# Patient Record
Sex: Female | Born: 1990 | Race: Black or African American | Hispanic: No | Marital: Single | Smoking: Current every day smoker
Health system: Southern US, Community
[De-identification: ages and names within clinical notes are randomized; demographics above are authoritative.]

## PROBLEM LIST (undated history)

## (undated) ENCOUNTER — Ambulatory Visit (HOSPITAL_COMMUNITY): Payer: 59

## (undated) DIAGNOSIS — R011 Cardiac murmur, unspecified: Secondary | ICD-10-CM

---
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
## 2009-04-08 ENCOUNTER — Emergency Department (HOSPITAL_COMMUNITY): Admission: EM | Admit: 2009-04-08 | Discharge: 2009-04-08 | Payer: Self-pay | Admitting: Family Medicine

## 2009-04-23 ENCOUNTER — Emergency Department (HOSPITAL_COMMUNITY): Admission: EM | Admit: 2009-04-23 | Discharge: 2009-04-23 | Payer: Self-pay | Admitting: Emergency Medicine

## 2009-05-21 ENCOUNTER — Emergency Department (HOSPITAL_COMMUNITY): Admission: EM | Admit: 2009-05-21 | Discharge: 2009-05-21 | Payer: Self-pay | Admitting: Emergency Medicine

## 2009-07-20 ENCOUNTER — Emergency Department (HOSPITAL_COMMUNITY): Admission: EM | Admit: 2009-07-20 | Discharge: 2009-07-20 | Payer: Self-pay | Admitting: Emergency Medicine

## 2009-10-21 ENCOUNTER — Emergency Department (HOSPITAL_COMMUNITY)
Admission: EM | Admit: 2009-10-21 | Discharge: 2009-10-21 | Payer: Self-pay | Source: Home / Self Care | Admitting: Emergency Medicine

## 2010-01-06 ENCOUNTER — Emergency Department (HOSPITAL_COMMUNITY)
Admission: EM | Admit: 2010-01-06 | Discharge: 2010-01-06 | Payer: Self-pay | Source: Home / Self Care | Admitting: Emergency Medicine

## 2010-03-26 ENCOUNTER — Emergency Department (HOSPITAL_COMMUNITY)
Admission: EM | Admit: 2010-03-26 | Discharge: 2010-03-26 | Disposition: A | Discharge status: Home / Self Care | Payer: Self-pay | Attending: Emergency Medicine | Admitting: Emergency Medicine

## 2010-03-26 DIAGNOSIS — M545 Low back pain, unspecified: Secondary | ICD-10-CM | POA: Insufficient documentation

## 2010-03-26 DIAGNOSIS — IMO0002 Reserved for concepts with insufficient information to code with codable children: Secondary | ICD-10-CM | POA: Insufficient documentation

## 2010-03-26 DIAGNOSIS — R109 Unspecified abdominal pain: Secondary | ICD-10-CM | POA: Insufficient documentation

## 2010-03-26 DIAGNOSIS — X58XXXA Exposure to other specified factors, initial encounter: Secondary | ICD-10-CM | POA: Insufficient documentation

## 2010-03-26 DIAGNOSIS — Y929 Unspecified place or not applicable: Secondary | ICD-10-CM | POA: Insufficient documentation

## 2010-03-26 LAB — URINE MICROSCOPIC-ADD ON

## 2010-03-26 LAB — POCT I-STAT, CHEM 8
BUN: 11 mg/dL (ref 6–23)
Chloride: 104 mEq/L (ref 96–112)
Glucose, Bld: 99 mg/dL (ref 70–99)
HCT: 40 % (ref 36.0–46.0)

## 2010-03-26 LAB — URINALYSIS, ROUTINE W REFLEX MICROSCOPIC
Ketones, ur: NEGATIVE mg/dL
Nitrite: NEGATIVE
Protein, ur: NEGATIVE mg/dL
Specific Gravity, Urine: 1.017 (ref 1.005–1.030)
Urobilinogen, UA: 1 mg/dL (ref 0.0–1.0)
pH: 7 (ref 5.0–8.0)

## 2010-03-26 LAB — POCT PREGNANCY, URINE: Preg Test, Ur: NEGATIVE

## 2010-03-27 LAB — URINE CULTURE
Colony Count: 70000
Culture  Setup Time: 201202292141

## 2010-04-10 LAB — PREGNANCY, URINE: Preg Test, Ur: NEGATIVE

## 2010-04-10 LAB — URINALYSIS, ROUTINE W REFLEX MICROSCOPIC
Hgb urine dipstick: NEGATIVE
Ketones, ur: NEGATIVE mg/dL
Specific Gravity, Urine: 1.028 (ref 1.005–1.030)
Urobilinogen, UA: 1 mg/dL (ref 0.0–1.0)
pH: 7.5 (ref 5.0–8.0)

## 2010-04-10 LAB — GC/CHLAMYDIA PROBE AMP, GENITAL: GC Probe Amp, Genital: NEGATIVE

## 2010-04-10 LAB — WET PREP, GENITAL: Yeast Wet Prep HPF POC: NONE SEEN

## 2010-04-13 LAB — URINALYSIS, ROUTINE W REFLEX MICROSCOPIC
Glucose, UA: NEGATIVE mg/dL
Nitrite: NEGATIVE
Specific Gravity, Urine: 1.02 (ref 1.005–1.030)
Urobilinogen, UA: 1 mg/dL (ref 0.0–1.0)
pH: 6 (ref 5.0–8.0)

## 2010-04-13 LAB — URINE MICROSCOPIC-ADD ON

## 2010-04-21 LAB — URINALYSIS, ROUTINE W REFLEX MICROSCOPIC
Bilirubin Urine: NEGATIVE
Glucose, UA: NEGATIVE mg/dL
Ketones, ur: NEGATIVE mg/dL
Nitrite: NEGATIVE
Protein, ur: NEGATIVE mg/dL
Specific Gravity, Urine: 1.017 (ref 1.005–1.030)
Urobilinogen, UA: 1 mg/dL (ref 0.0–1.0)
pH: 7 (ref 5.0–8.0)

## 2010-04-21 LAB — URINE MICROSCOPIC-ADD ON

## 2010-04-30 LAB — WET PREP, GENITAL: Trich, Wet Prep: NONE SEEN

## 2010-04-30 LAB — POCT PREGNANCY, URINE: Preg Test, Ur: NEGATIVE

## 2010-04-30 LAB — URINE MICROSCOPIC-ADD ON

## 2010-04-30 LAB — URINALYSIS, ROUTINE W REFLEX MICROSCOPIC
Bilirubin Urine: NEGATIVE
Glucose, UA: NEGATIVE mg/dL
Ketones, ur: NEGATIVE mg/dL
Nitrite: NEGATIVE
Protein, ur: NEGATIVE mg/dL
Specific Gravity, Urine: 1.021 (ref 1.005–1.030)
Urobilinogen, UA: 0.2 mg/dL (ref 0.0–1.0)
pH: 5.5 (ref 5.0–8.0)

## 2010-04-30 LAB — GC/CHLAMYDIA PROBE AMP, GENITAL
Chlamydia, DNA Probe: NEGATIVE
GC Probe Amp, Genital: NEGATIVE
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
## 2010-10-15 ENCOUNTER — Emergency Department (HOSPITAL_COMMUNITY)
Admission: EM | Admit: 2010-10-15 | Discharge: 2010-10-15 | Disposition: A | Discharge status: Home / Self Care | Payer: Self-pay | Attending: Emergency Medicine | Admitting: Emergency Medicine

## 2010-10-15 DIAGNOSIS — M549 Dorsalgia, unspecified: Secondary | ICD-10-CM | POA: Insufficient documentation

## 2010-10-15 DIAGNOSIS — Z87891 Personal history of nicotine dependence: Secondary | ICD-10-CM | POA: Insufficient documentation

## 2010-10-15 LAB — URINALYSIS, ROUTINE W REFLEX MICROSCOPIC
Bilirubin Urine: NEGATIVE
Glucose, UA: NEGATIVE mg/dL
Hgb urine dipstick: NEGATIVE
Ketones, ur: NEGATIVE mg/dL
Leukocytes, UA: NEGATIVE
Nitrite: NEGATIVE
Protein, ur: NEGATIVE mg/dL
Urobilinogen, UA: 1 mg/dL (ref 0.0–1.0)
pH: 6 (ref 5.0–8.0)

## 2010-10-15 LAB — POCT I-STAT, CHEM 8
BUN: 10 mg/dL (ref 6–23)
Sodium: 140 mEq/L (ref 135–145)

## 2011-11-11 ENCOUNTER — Encounter (HOSPITAL_COMMUNITY): Payer: Self-pay | Admitting: *Deleted

## 2011-11-11 ENCOUNTER — Emergency Department (HOSPITAL_COMMUNITY)
Admission: EM | Admit: 2011-11-11 | Discharge: 2011-11-11 | Disposition: A | Discharge status: Home / Self Care | Payer: Self-pay | Attending: Emergency Medicine | Admitting: Emergency Medicine

## 2011-11-11 DIAGNOSIS — F17200 Nicotine dependence, unspecified, uncomplicated: Secondary | ICD-10-CM | POA: Insufficient documentation

## 2011-11-11 DIAGNOSIS — S61209A Unspecified open wound of unspecified finger without damage to nail, initial encounter: Secondary | ICD-10-CM | POA: Insufficient documentation

## 2011-11-11 DIAGNOSIS — W230XXA Caught, crushed, jammed, or pinched between moving objects, initial encounter: Secondary | ICD-10-CM | POA: Insufficient documentation

## 2011-11-11 DIAGNOSIS — Z880 Allergy status to penicillin: Secondary | ICD-10-CM | POA: Insufficient documentation

## 2011-11-11 DIAGNOSIS — S61309A Unspecified open wound of unspecified finger with damage to nail, initial encounter: Secondary | ICD-10-CM

## 2011-11-11 HISTORY — DX: Cardiac murmur, unspecified: R01.1

## 2011-11-11 NOTE — ED Notes (Signed)
Pt fake fingernail broke off last night. Pt complaining of pain to ring finger on left hand.

## 2011-11-11 NOTE — ED Provider Notes (Signed)
History     CSN: [id number]  Arrival date & time 11/11/11  2952   First MD Initiated Contact with Patient 11/11/11 [phone]      Chief Complaint  Patient presents with  . Finger Injury    finger nail    (Consider location/radiation/quality/duration/timing/severity/associated sxs/prior treatment) HPI Comments: Haley Glass is a 21 y.o. Female who presents with complaint of a finger pain after getting it shut in a dryer. States had acrylic long nails on and her nail got pulled back, ripping part of her own finger nail off. Reports pain and partial avulsion of her finger nail. Pt denies bleeding, swelling. No pain with rom of the finger. No weakness or numbness of the finger. Tetanus with in last 5 years. No other injuries. Pt states she rinsed finger with peroxide.    Past Medical History  Diagnosis Date  . Heart murmur     History reviewed. No pertinent past surgical history.  History reviewed. No pertinent family history.  History  Substance Use Topics  . Smoking status: Current Every Day Smoker  . Smokeless tobacco: Not on file  . Alcohol Use: Yes    OB History    Grav Para Term Preterm Abortions TAB SAB Ect Mult Living                  Review of Systems  Constitutional: Negative for fever and chills.  Respiratory: Negative.   Cardiovascular: Negative.   Skin: Positive for wound.  Neurological: Negative for weakness and numbness.    Allergies  Amoxicillin and Penicillins  Home Medications   Current Outpatient Rx  Name Route Sig Dispense Refill  . IBUPROFEN 200 MG PO TABS Oral Take 800 mg by mouth every 6 (six) hours as needed. For headache      BP 125/77  Temp 98.1 F (36.7 C) (Oral)  Resp 18  SpO2 100%  Physical Exam  Nursing note and vitals reviewed. Constitutional: She appears well-developed and well-nourished. No distress.  Cardiovascular: Normal rate, regular rhythm and normal heart sounds.   Pulmonary/Chest: Effort normal and breath sounds  normal. No respiratory distress. She has no wheezes. She has no rales.  Musculoskeletal:       Hands:      Full rom of the left ring finger at all joints. No tenderness to palpation other than over the partially avulsed finger nail. No bleeding.   Neurological: She is alert.  Skin: Skin is warm and dry.    ED Course  Procedures (including critical care time)  Partially avulsed finger nail. No bony tenderness, full rom at all jonints of the finger. Do not think imaging is necessary. Tetanus is up to date. Wound is clean.  I applied bacitracin ointment and a dressing to the finger. No further treatment necessary.  1. Partial avulsion of fingernail       MDM          Lottie Mussel, PA 11/11/11 1640

## 2011-11-12 NOTE — ED Provider Notes (Signed)
Medical screening examination/treatment/procedure(s) were performed by non-physician practitioner and as supervising physician I was immediately available for consultation/collaboration.    Vida Roller, MD 11/12/11 [phone]

## 2012-03-05 ENCOUNTER — Emergency Department (HOSPITAL_COMMUNITY)
Admission: EM | Admit: 2012-03-05 | Discharge: 2012-03-05 | Disposition: A | Discharge status: Home / Self Care | Payer: Self-pay | Attending: Emergency Medicine | Admitting: Emergency Medicine

## 2012-03-05 ENCOUNTER — Encounter (HOSPITAL_COMMUNITY): Payer: Self-pay | Admitting: *Deleted

## 2012-03-05 DIAGNOSIS — Z3202 Encounter for pregnancy test, result negative: Secondary | ICD-10-CM | POA: Insufficient documentation

## 2012-03-05 DIAGNOSIS — F17200 Nicotine dependence, unspecified, uncomplicated: Secondary | ICD-10-CM | POA: Insufficient documentation

## 2012-03-05 DIAGNOSIS — N390 Urinary tract infection, site not specified: Secondary | ICD-10-CM | POA: Insufficient documentation

## 2012-03-05 DIAGNOSIS — N898 Other specified noninflammatory disorders of vagina: Secondary | ICD-10-CM | POA: Insufficient documentation

## 2012-03-05 DIAGNOSIS — R350 Frequency of micturition: Secondary | ICD-10-CM | POA: Insufficient documentation

## 2012-03-05 DIAGNOSIS — R011 Cardiac murmur, unspecified: Secondary | ICD-10-CM | POA: Insufficient documentation

## 2012-03-05 LAB — URINALYSIS, ROUTINE W REFLEX MICROSCOPIC
Bilirubin Urine: NEGATIVE
Glucose, UA: NEGATIVE mg/dL
Ketones, ur: NEGATIVE mg/dL
Protein, ur: NEGATIVE mg/dL

## 2012-03-05 LAB — URINE MICROSCOPIC-ADD ON

## 2012-03-05 MED ORDER — SULFAMETHOXAZOLE-TRIMETHOPRIM 800-160 MG PO TABS: 1.0000 | ORAL_TABLET | Freq: Two times a day (BID) | ORAL | Status: DC

## 2012-03-05 NOTE — ED Notes (Signed)
Pt reports having urinary frequency, pressure when urinating. Also having vaginal discharge and irritation. No acute distress noted at triage.

## 2012-03-05 NOTE — ED Provider Notes (Signed)
History     CSN: [id number]  Arrival date & time 03/05/12  1024   First MD Initiated Contact with Patient 03/05/12 1109      Chief Complaint  Patient presents with  . Dysuria  . Urinary Frequency    (Consider location/radiation/quality/duration/timing/severity/associated sxs/prior treatment) Patient is a 22 y.o. female presenting with dysuria and frequency. The history is provided by the patient.  Dysuria  Associated symptoms include frequency.  Urinary Frequency   patient here with dysuria and suprapubic pain x1 day. No fever or flank pain. History of UTIs and this feels like that. Denies any vaginal bleeding or vaginal discharge. No medications taken prior to arrival. Symptoms worse with urinating  Past Medical History  Diagnosis Date  . Heart murmur     History reviewed. No pertinent past surgical history.  History reviewed. No pertinent family history.  History  Substance Use Topics  . Smoking status: Current Every Day Smoker  . Smokeless tobacco: Not on file  . Alcohol Use: Yes    OB History   Grav Para Term Preterm Abortions TAB SAB Ect Mult Living                  Review of Systems  Genitourinary: Positive for dysuria and frequency.  All other systems reviewed and are negative.    Allergies  Amoxicillin and Penicillins  Home Medications   Current Outpatient Rx  Name  Route  Sig  Dispense  Refill  . ibuprofen (ADVIL,MOTRIN) 200 MG tablet   Oral   Take 800 mg by mouth every 8 (eight) hours as needed (for menstrual pain). For headache           BP 119/69  Pulse 79  Temp(Src) 98.6 F (37 C) (Oral)  Resp 20  SpO2 100%  LMP 02/09/2012  Physical Exam  Nursing note and vitals reviewed. Constitutional: She is oriented to person, place, and time. She appears well-developed and well-nourished.  Non-toxic appearance. No distress.  HENT:  Head: Normocephalic and atraumatic.  Eyes: Conjunctivae, EOM and lids are normal. Pupils are equal, round,  and reactive to light.  Neck: Normal range of motion. Neck supple. No tracheal deviation present. No mass present.  Cardiovascular: Normal rate, regular rhythm and normal heart sounds.  Exam reveals no gallop.   No murmur heard. Pulmonary/Chest: Effort normal and breath sounds normal. No stridor. No respiratory distress. She has no decreased breath sounds. She has no wheezes. She has no rhonchi. She has no rales.  Abdominal: Soft. Normal appearance and bowel sounds are normal. She exhibits no distension. There is tenderness in the suprapubic area. There is no rigidity, no rebound, no guarding and no CVA tenderness.  Musculoskeletal: Normal range of motion. She exhibits no edema and no tenderness.  Neurological: She is alert and oriented to person, place, and time. She has normal strength. No cranial nerve deficit or sensory deficit. GCS eye subscore is 4. GCS verbal subscore is 5. GCS motor subscore is 6.  Skin: Skin is warm and dry. No abrasion and no rash noted.  Psychiatric: She has a normal mood and affect. Her speech is normal and behavior is normal.    ED Course  Procedures (including critical care time)  Labs Reviewed  URINALYSIS, ROUTINE W REFLEX MICROSCOPIC  POCT PREGNANCY, URINE   No results found.   No diagnosis found.    MDM  Pt to be tx for uti        Toy Baker, MD 03/05/12 1230

## 2012-03-05 NOTE — ED Notes (Signed)
Pt discharged to home with family. NAD.  

## 2012-03-07 LAB — URINE CULTURE

## 2012-03-08 NOTE — ED Notes (Signed)
+   Urine Patient treated with Septra-sensitive to same-chart appended per protocol MD. 

## 2014-09-08 ENCOUNTER — Emergency Department (HOSPITAL_COMMUNITY)
Admission: EM | Admit: 2014-09-08 | Discharge: 2014-09-08 | Disposition: A | Discharge status: Home / Self Care | Payer: Self-pay | Attending: Emergency Medicine | Admitting: Emergency Medicine

## 2014-09-08 ENCOUNTER — Encounter (HOSPITAL_COMMUNITY): Payer: Self-pay | Admitting: *Deleted

## 2014-09-08 DIAGNOSIS — Z880 Allergy status to penicillin: Secondary | ICD-10-CM | POA: Insufficient documentation

## 2014-09-08 DIAGNOSIS — R011 Cardiac murmur, unspecified: Secondary | ICD-10-CM | POA: Insufficient documentation

## 2014-09-08 DIAGNOSIS — R202 Paresthesia of skin: Secondary | ICD-10-CM | POA: Insufficient documentation

## 2014-09-08 DIAGNOSIS — Z720 Tobacco use: Secondary | ICD-10-CM | POA: Insufficient documentation

## 2014-09-08 NOTE — ED Notes (Signed)
PT reports Lt shoulder pain worse on Thursday. Pt reports now there is tingling in LT arm this is new. Pt has ROM to LT arm.

## 2014-09-08 NOTE — ED Notes (Signed)
Declined W/C at D/C and was escorted to lobby by RN. 

## 2014-09-08 NOTE — Discharge Instructions (Signed)
Paresthesia Return for any upper extremity weakness or numbness, neck pain. Follow-up with a provider using the resource guide below. Paresthesia is an abnormal burning or prickling sensation. This sensation is generally felt in the hands, arms, legs, or feet. However, it may occur in any part of the body. It is usually not painful. The feeling may be described as:  Tingling or numbness.  "Pins and needles."  Skin crawling.  Buzzing.  Limbs "falling asleep."  Itching. Most people experience temporary (transient) paresthesia at some time in their lives. CAUSES  Paresthesia may occur when you breathe too quickly (hyperventilation). It can also occur without any apparent cause. Commonly, paresthesia occurs when pressure is placed on a nerve. The feeling quickly goes away once the pressure is removed. For some people, however, paresthesia is a long-lasting (chronic) condition caused by an underlying disorder. The underlying disorder may be:  A traumatic, direct injury to nerves. Examples include a:  Broken (fractured) neck.  Fractured skull.  A disorder affecting the brain and spinal cord (central nervous system). Examples include:  Transverse myelitis.  Encephalitis.  Transient ischemic attack.  Multiple sclerosis.  Stroke.  Tumor or blood vessel problems, such as an arteriovenous malformation pressing against the brain or spinal cord.  A condition that damages the peripheral nerves (peripheral neuropathy). Peripheral nerves are not part of the brain and spinal cord. These conditions include:  Diabetes.  Peripheral vascular disease.  Nerve entrapment syndromes, such as carpal tunnel syndrome.  Shingles.  Hypothyroidism.  Vitamin B12 deficiencies.  Alcoholism.  Heavy metal poisoning (lead, arsenic).  Rheumatoid arthritis.  Systemic lupus erythematosus. DIAGNOSIS  Your caregiver will attempt to find the underlying cause of your paresthesia. Your caregiver  may:  Take your medical history.  Perform a physical exam.  Order various lab tests.  Order imaging tests. TREATMENT  Treatment for paresthesia depends on the underlying cause. HOME CARE INSTRUCTIONS  Avoid drinking alcohol.  You may consider massage or acupuncture to help relieve your symptoms.  Keep all follow-up appointments as directed by your caregiver. SEEK IMMEDIATE MEDICAL CARE IF:   You feel weak.  You have trouble walking or moving.  You have problems with speech or vision.  You feel confused.  You cannot control your bladder or bowel movements.  You feel numbness after an injury.  You faint.  Your burning or prickling feeling gets worse when walking.  You have pain, cramps, or dizziness.  You develop a rash. MAKE SURE YOU:  Understand these instructions.  Will watch your condition.  Will get help right away if you are not doing well or get worse. Document Released: 01/02/2002 Document Revised: 04/06/2011 Document Reviewed: 10/03/2010 [facility] Patient Information 2015 [place], [place]. This information is not intended to replace advice given to you by your health care provider. Make sure you discuss any questions you have with your health care provider.  Emergency Department Resource Guide 1) Find a Doctor and Pay Out of Pocket Although you won't have to find out who is covered by your insurance plan, it is a good idea to ask around and get recommendations. You will then need to call the office and see if the doctor you have chosen will accept you as a new patient and what types of options they offer for patients who are self-pay. Some doctors offer discounts or will set up payment plans for their patients who do not have insurance, but you will need to ask so you aren't surprised when you get to your  appointment.  2) Contact Your Local Health Department Not all health departments have doctors that can see patients for sick visits, but many do, so it is  worth a call to see if yours does. If you don't know where your local health department is, you can check in your phone book. The CDC also has a tool to help you locate your state's health department, and many state websites also have listings of all of their local health departments.  3) Find a Walk-in Clinic If your illness is not likely to be very severe or complicated, you may want to try a walk in clinic. These are popping up all over the country in pharmacies, drugstores, and shopping centers. They're usually staffed by nurse practitioners or physician assistants that have been trained to treat common illnesses and complaints. They're usually fairly quick and inexpensive. However, if you have serious medical issues or chronic medical problems, these are probably not your best option.  No Primary Care Doctor: - Call Health Connect at  [phone] - they can help you locate a primary care doctor that  accepts your insurance, provides certain services, etc. - Physician Referral Service- [phone]  Chronic Pain Problems: Organization         Address  Phone   Notes  [facility]  [phone] Patients need to be referred by their primary care doctor.   Medication Assistance: Organization         Address  Phone   Notes  [facility] Medication [facility] [address] [place] [place], [place] [address] [phone] --Must be a resident of [facility] -- Must have NO insurance coverage whatsoever (no Medicaid/ Medicare, etc.) -- The pt. MUST have a primary care doctor that directs their care regularly and follows them in the community   [facility]ist  [phone]   [employer]  [phone]    Agencies that provide inexpensive medical care: Organization         Address  Phone   Notes  [facility]  [phone]   [facility]    [phone]   [facility] [address] [place],  [place] [address] [phone]   [facility] [address] [place] [place] [phone]   Planned Parenthood    [phone]   [facility]    [phone]   [facility]  [address], [place] Phone:  [phone], Fax:  [phone] Hours of Operation:  9 am - 6 pm, M-F.  Also accepts Medicaid/Medicare and self-pay.  [facility]  [address], [place] Phone: [phone], Fax: [phone]. Hours of Operation:  8:30 am - 5:30 pm, M-F.  Also accepts Medicaid and self-pay.  [facility] [place] [address] [place] Phone: [phone]   [facility] [address] Natasha Bence [place], [place] [phone], Ext. [phone] Mondays & Thursdays: 7-9 AM.  First 15 patients are seen on a first come, first serve basis.    Medicaid-accepting [facility] Providers:  Organization         Address  Phone   Notes  [facility] [address], [place] [phone] Also accepts self-pay patients.  [facility] [address] Laurell Josephs [place], [place]  [phone]   [facility] [address], [place] [phone]   [facility] [address] [place] [phone]   Renaye Rakers [address], [place]   [phone] Only accepts [place] Access [place] patients after they have their name applied to their card.   Self-Pay (no insurance) in [facility]:  Organization         Address  Phone   Notes  Sickle Cell Patients, [facility] Internal Medicine [address] [place], [place] [phone]   [facility] [address] [place], [place] [phone]   [facility]  [address], [place] [phone]   [facility]/Dr. Osei-Bonsu  [address], [place] or [address], [place] [phone] Phone number for both [place] and [place] locations is the same.  [facility] [address], [place] [phone]   [facility] [address], [place] or [address] [phone]   [facility] [address] [place] [phone], phone; [phone], fax Sees patients 1st and 3rd Saturday of every month.  Must not qualify for public or private insurance (i.e. Medicaid, Medicare, [place] Health Choice, Veterans' Benefits)  Household income should be no more than 200% of the poverty level The clinic cannot treat you if you are pregnant or think you are pregnant  Sexually transmitted diseases are not treated at the clinic.    Dental Care: Organization         Address  Phone  Notes  [facility] Department of [facility] [address] [place], [place] [phone] Accepts children up to age 13 who are enrolled in [place] or [place] Health Choice; pregnant women with a Medicaid card; and children who have applied for Medicaid or [place] Health Choice, but were declined, whose parents can pay a reduced fee at time of service.  [facility]  [address], [place] [phone] Accepts children up to age 37 who are enrolled in [place] or [place] Health Choice; pregnant women with a Medicaid card; and children who have applied for Medicaid or [place] Health Choice, but were declined, whose parents can pay a reduced fee at time of service.  [facility]  [address] [place], [place] [phone] Patients are seen by appointment only. Walk-ins are not accepted. [facility] will see patients 47 years of age and older. Monday - Tuesday (8am-5pm) Most Wednesdays (8:30-5pm) $30 per visit, cash only  [facility]  [address], [facility] [phone]  Patients are seen by appointment only. Walk-ins are not accepted. [facility] will see patients 86 years of age and older. One Wednesday Evening (Monthly: Volunteer Based).  $30 per visit, cash only  [employer]  [phone] for adults; Children under age 36, call Graduate Pediatric Dentistry at [phone]. Children aged 57-14, please call [phone] to request a pediatric application.  Dental services are provided in all areas of dental care including fillings, crowns and bridges, complete and partial dentures, implants, gum treatment, root canals, and extractions. Preventive care is also provided. Treatment is provided to both adults and children. Patients are selected via a lottery and there is often a waiting list.   [facility] [address], [place]  [phone] [email or web address]   [facility] [address] [place], [place] [phone], Ext. [phone] Second and Fourth Thursday of each month, opens at 6:30 AM; Clinic ends at 9 AM.  Patients are seen on a first-come first-served basis, and a limited number are seen during each clinic.   [facility]  [address] Ether Griffins [place], [place] [phone]   Eligibility Requirements You must have lived in [place], [place], or [place] counties for at least the last three months.   You cannot be eligible for state or federal sponsored [place], including [employer], [place], or [employer].   You generally cannot be eligible for healthcare insurance through your employer.    How to apply: Eligibility screenings are held every Tuesday and Wednesday afternoon from 1:00 pm until 4:00 pm. You do not need an appointment for the interview!  [facility] [address] [place], [place] [phone]   [facility] Health Department  [phone]   [facility] Health Department  [phone]   [facility] Health Department   [phone]    Behavioral Health Resources in the Community: Intensive Outpatient Programs Organization         Address  Phone  Notes  [facility] [address] [place], [place] [phone]   [facility] Outpatient [address] [place], [place] [phone]   [facility] [address] [place], [place]  [phone]   [facility] Mental Health [address],  [place], [place] [phone] or [phone]   Substance Abuse Resources Organization         Address  Phone  Notes  [facility]  [phone]   [facility]  [phone]   [facility] [place]  [phone]   Floydene Flock  [phone]   Residential & Outpatient Substance Abuse Program  [phone]   Psychological Services Organization         Address  Phone  Notes  [facility] Behavioral Health  [phone]   [facility]  [phone]   [facility] [address] [place] [phone] or [phone]    Mobile Crisis Teams Organization         Address  Phone  Notes  [facility], Mobile Crisis Care Unit  [phone]   [facility]  [address] [place], [place] [phone]   Doristine Locks [address] [place] [phone]    Self-Help/Support Groups Organization         Address  Phone             Notes  [facility] [place] - variety of support groups  336- I7437963 Call for more information  Narcotics Anonymous (NA), [facility] [address], [employer] [place]  2 meetings at this location   Statistician         Address  Phone  Notes  [facility] [address],    [place]  [phone]   [facility]  [address], [place] [address], [place], [place] [phone]   [facility] [address] [place], [place] [phone] Admissions: 8am-3pm M-F   [facility] [address]    [place], [place] [phone]   [facility] [address] [place], [place], [place] [phone]   [facility] [address]  [place], [place] [phone]  [facility] - Intensive Outpatient [address], [place], [place] [phone]   °[facility] ([facility] [address]  °[place], [place] [phone] or [phone]   °[facility] ([facility]) [address] [place], [place] [phone] Accepts Medicaid  °[facility] [address]  °[place] 1-800-659-3381 Substance Abuse/Addiction Treatment  ° °[place] Behavioral Health Resources °Organization         Address  Phone  Notes  °[facility]  ([phone]) [phone]   °Julie Brannon, PhD [address] [place], [place]   ([phone]) [phone] or (336) [phone]   °[facility]   [address] °[place], [place] ([phone]) [phone]   °[facility] [address], [place], [place] (336) [phone] Insurance/Medicaid/sponsorship through [facility]  °[facility] [address]                                    [place], [place] ([phone]) [phone] Therapy/tele-psych/case  °[facility] [address]  ° [place], [place] ([phone]) [phone]    °Dr. Arfeen  ([phone]) [phone]   °[facility] [place]  [employer] [place] [facility] 1) [address], [place] °2) [address], [place] °3)  [address], [place] ([phone]) [phone] °(336) [phone] ° °(336) [phone]   °[place] Child Abuse Hotline (336) 342-1394 or (336) [phone] (After Hours)    ° ° ° °

## 2014-09-08 NOTE — ED Provider Notes (Signed)
CSN: [id number]     Arrival date & time 09/08/14  0730 History   First MD Initiated Contact with Patient 09/08/14 [id number]     Chief Complaint  Patient presents with  . Shoulder Pain     (Consider location/radiation/quality/duration/timing/severity/associated sxs/prior Treatment) Patient is a 24 y.o. female presenting with shoulder pain. The history is provided by the patient. No language interpreter was used.  Shoulder Pain Associated symptoms: no neck pain   Haley Glass is a 24 y.o female who presents for intermittent left scapular numbness and tingling, and pain for the past 2 days. She states she has taken both Tylenol and ibuprofen. She denies any injury or fall, or vigorous activity. She denies any fever, neck pain, thoracic or lumbar back pain, headache, upper extremity weakness, chest pain, or shortness of breath. She does administrative work. Past Medical History  Diagnosis Date  . Heart murmur    History reviewed. No pertinent past surgical history. History reviewed. No pertinent family history. Social History  Substance Use Topics  . Smoking status: Current Every Day Smoker  . Smokeless tobacco: None  . Alcohol Use: Yes   OB History    No data available     Review of Systems  Musculoskeletal: Negative for neck pain.  Skin: Negative for rash.  Neurological: Positive for numbness. Negative for weakness.      Allergies  Amoxicillin and Penicillins  Home Medications   Prior to Admission medications   Medication Sig Start Date End Date Taking? Authorizing Provider  ibuprofen (ADVIL,MOTRIN) 200 MG tablet Take 800 mg by mouth every 8 (eight) hours as needed (for menstrual pain). For headache    Historical Provider, MD  sulfamethoxazole-trimethoprim (SEPTRA DS) 800-160 MG per tablet Take 1 tablet by mouth every 12 (twelve) hours. 03/05/12   Lorre Nick, MD   BP 114/70 mmHg  Pulse 69  Temp(Src) 98.4 F (36.9 C) (Oral)  Resp 16  Ht 5\' 6"  (1.676 m)  Wt 198 lb  (89.812 kg)  BMI 31.97 kg/m2  SpO2 100%  LMP 08/24/2014 Physical Exam  Constitutional: She is oriented to person, place, and time. She appears well-developed and well-nourished.  HENT:  Head: Normocephalic.  Eyes: Conjunctivae are normal.  Neck: Normal range of motion. Neck supple.  Cardiovascular: Normal rate.   Pulmonary/Chest: Effort normal.  Musculoskeletal: Normal range of motion. She exhibits no edema.  Tenderness to palpation along the left scapula. No rash, edema or ecchymosis. Full ROM of the left arm including abduction and abduction without difficulty. 5/5 strength. NVI. No scapular winging. No midline cervical, thoracic tenderness to palpation. Full ROM of the neck including flexion and extension without reproducible pain or numbness.  Neurological: She is alert and oriented to person, place, and time.  Skin: Skin is warm and dry.  Psychiatric: She has a normal mood and affect. Her behavior is normal.  Nursing note and vitals reviewed.   ED Course  Procedures (including critical care time) Labs Review Labs Reviewed - No data to display  Imaging Review No results found. I, Catha Gosselin, personally reviewed and evaluated these images and lab results as part of my medical decision-making.   EKG Interpretation None      MDM   Final diagnoses:  Paresthesia   Patient presents for numbness and tingling to the left scapula. Vitals stable. I discussed that this is most likely parasthesias. She can continue taking Tylenol or Motrin as needed for pain. I also discussed return precautions and gave her the resource guide  for follow-up. Patient verbally agrees with the plan.     Catha Gosselin, PA-C 09/08/14 0825  Cathren Laine, MD 09/08/14 [phone]

## 2014-11-16 ENCOUNTER — Emergency Department (HOSPITAL_COMMUNITY)
Admission: EM | Admit: 2014-11-16 | Discharge: 2014-11-16 | Disposition: A | Discharge status: Home / Self Care | Payer: Self-pay | Source: Home / Self Care | Attending: Family Medicine | Admitting: Family Medicine

## 2014-11-16 ENCOUNTER — Encounter (HOSPITAL_COMMUNITY): Payer: Self-pay | Admitting: Emergency Medicine

## 2014-11-16 DIAGNOSIS — H6981 Other specified disorders of Eustachian tube, right ear: Secondary | ICD-10-CM

## 2014-11-16 DIAGNOSIS — T161XXA Foreign body in right ear, initial encounter: Secondary | ICD-10-CM

## 2014-11-16 DIAGNOSIS — R0982 Postnasal drip: Secondary | ICD-10-CM

## 2014-11-16 NOTE — Discharge Instructions (Signed)
Ear Foreign Body °An ear foreign body is an object that is stuck in your ear. The object is usually stuck in the ear canal. °CAUSES °In all ages of people, the most common foreign bodies are insects that enter the ear canal. It is common for young children to put objects into the ear canal. These may include pebbles, beads, parts of toys, and any other small objects that fit into the ear. In adults, objects such as cotton swabs may become lodged in the ear canal.  °SIGNS AND SYMPTOMS °A foreign body in the ear may cause: °· Pain. °· Buzzing or roaring sounds. °· Hearing loss. °· Ear drainage or bleeding. °· Nausea and vomiting. °· A feeling that your ear is full. °DIAGNOSIS °Your health care provider may be able to diagnose an ear foreign body based on the information that you provide, your symptoms, and a physical exam. Your health care provider may also perform tests, such as testing your hearing and your ear pressure, to check for infection or other problems that are caused by the foreign body in your ear. °TREATMENT °Treatment depends on what the foreign body is, the location of the foreign body in your ear, and whether or not the foreign body has injured any part of your inner ear. If the foreign body is visible to your health care provider, it may be possible to remove the foreign body using: °· A tool, such as medical tweezers (forceps) or a suction tube (catheter). °· Irrigation. This uses water to flush the foreign body out of your ear. This is used only if the foreign body is not likely to swell or enlarge when it is put in water. °If the foreign body is not visible or your health care provider was not able to remove the foreign body, you may be referred to a specialist for removal. You may also be prescribed antibiotic medicine or ear drops to prevent infection. If the foreign body has caused injury to other parts of your ear, you may need additional treatment. °HOME CARE INSTRUCTIONS °· Keep all  follow-up visits as directed by your health care provider. This is important. °· Take medicines only as directed by your health care provider. °· If you were prescribed an antibiotic medicine, finish it all even if you start to feel better. °PREVENTION °· Keep small objects out of reach of young children. Tell children not to put anything in their ears. °· Do not put anything in your ear, including cotton swabs, to clean your ears. Talk to your health care provider about how to clean your ears safely. °SEEK MEDICAL CARE IF: °· You have a headache. °· Your have blood coming from your ear. °· You have a fever. °· You have increased pain or swelling of your ear. °· Your hearing is reduced. °· You have discharge coming from your ear. °  °This information is not intended to replace advice given to you by your health care provider. Make sure you discuss any questions you have with your health care provider. °  °Document Released: 01/10/2000 Document Revised: 02/02/2014 Document Reviewed: 08/28/2013 °Elsevier Interactive Patient Education ©2016 [employer] ° °

## 2014-11-16 NOTE — ED Provider Notes (Signed)
CSN: [id number]     Arrival date & time 11/16/14  1742 History   First MD Initiated Contact with Patient 11/16/14 1940     Chief Complaint  Patient presents with  . Otalgia   (Consider location/radiation/quality/duration/timing/severity/associated sxs/prior Treatment) HPI Comments: 3 days ago this 24 year old female had a minor sore throat but states feels better now. She is now complaining of pain to the right ear and pain to the soft tissue posterior to the angle of the right jaw. Denies fever, chills, headache, rhinorrhea.   Past Medical History  Diagnosis Date  . Heart murmur    History reviewed. No pertinent past surgical history. No family history on file. Social History  Substance Use Topics  . Smoking status: Current Every Day Smoker  . Smokeless tobacco: None  . Alcohol Use: Yes   OB History    No data available     Review of Systems  Constitutional: Negative.   HENT: Positive for ear pain and postnasal drip. Negative for congestion, ear discharge, rhinorrhea and sore throat.   Eyes: Negative.   Respiratory: Negative.   Cardiovascular: Negative.   Skin: Negative.   All other systems reviewed and are negative.   Allergies  Amoxicillin and Penicillins  Home Medications   Prior to Admission medications   Medication Sig Start Date End Date Taking? Authorizing Provider  ibuprofen (ADVIL,MOTRIN) 200 MG tablet Take 800 mg by mouth every 8 (eight) hours as needed (for menstrual pain). For headache    Historical Provider, MD  sulfamethoxazole-trimethoprim (SEPTRA DS) 800-160 MG per tablet Take 1 tablet by mouth every 12 (twelve) hours. 03/05/12   Lorre Nick, MD   Meds Ordered and Administered this Visit  Medications - No data to display  BP 127/86 mmHg  Pulse 80  Temp(Src) 98.9 F (37.2 C) (Oral)  SpO2 99%  LMP 11/03/2014 No data found.   Physical Exam  Constitutional: She appears well-developed and well-nourished. No distress.  HENT:  Right EAC with  rectangular foreign body adjacent to the TM. TM is pearly gray transparent and without discoloration or effusion. Left TM is normal. Oropharynx with clear PND otherwise clear.  Eyes: Conjunctivae and EOM are normal.  Neck: Normal range of motion. Neck supple.  Cardiovascular: Normal rate, regular rhythm and normal heart sounds.   Pulmonary/Chest: Effort normal.  Lymphadenopathy:    She has no cervical adenopathy.  Neurological: She is alert.  Skin: Skin is warm and dry.  Nursing note and vitals reviewed.   ED Course  .Foreign Body Removal Date/Time: 11/16/2014 8:38 PM Performed by: Phineas Real, Kirt Chew Authorized by: Ozella Rocks Consent: Verbal consent obtained. Risks and benefits: risks, benefits and alternatives were discussed Consent given by: patient Patient understanding: patient states understanding of the procedure being performed Patient identity confirmed: verbally with patient Body area: ear Location details: right ear Localization method: ENT speculum Removal mechanism: irrigation Complexity: simple 1 objects recovered. Objects recovered: as per MDM Post-procedure assessment: foreign body removed Patient tolerance: Patient tolerated the procedure well with no immediate complications   (including critical care time)  Labs Review Labs Reviewed - No data to display  Imaging Review No results found.   Visual Acuity Review  Right Eye Distance:   Left Eye Distance:   Bilateral Distance:    Right Eye Near:   Left Eye Near:    Bilateral Near:         MDM   1. Foreign body in right ear, initial encounter   2. ETD (eustachian  tube dysfunction), right   3. PND (post-nasal drip)    right ear was irrigated and the foreign body was removed. This appeared to be a fragment of acrylic nail polish. There is no apparent injury to the TM. It is mildly retracted but no erythema or signs of infection. She may continue to take Sudafed PE as needed for congestion and  may add Zyrtec Claritin or Allegra for drainage.   Hayden Rasmussenavid Venus Gilles, NP 11/16/14 2044

## 2014-11-16 NOTE — ED Notes (Signed)
C/o right ear pain onset Tuesday associated w/sore throat that has subsided A&O x4... No acute distress.

## 2015-11-05 ENCOUNTER — Other Ambulatory Visit: Payer: Self-pay | Admitting: Nurse Practitioner

## 2015-11-05 ENCOUNTER — Other Ambulatory Visit (HOSPITAL_COMMUNITY)
Admission: RE | Admit: 2015-11-05 | Discharge: 2015-11-05 | Disposition: A | Discharge status: Home / Self Care | Payer: BLUE CROSS/BLUE SHIELD | Source: Ambulatory Visit | Attending: Obstetrics and Gynecology | Admitting: Obstetrics and Gynecology

## 2015-11-05 DIAGNOSIS — Z01419 Encounter for gynecological examination (general) (routine) without abnormal findings: Secondary | ICD-10-CM | POA: Diagnosis present

## 2015-11-07 LAB — CYTOLOGY - PAP

## 2016-03-04 ENCOUNTER — Encounter (HOSPITAL_COMMUNITY): Payer: Self-pay | Admitting: Family Medicine

## 2016-03-04 ENCOUNTER — Ambulatory Visit (HOSPITAL_COMMUNITY)
Admission: EM | Admit: 2016-03-04 | Discharge: 2016-03-04 | Disposition: A | Discharge status: Home / Self Care | Payer: BLUE CROSS/BLUE SHIELD | Attending: Family Medicine | Admitting: Family Medicine

## 2016-03-04 DIAGNOSIS — K219 Gastro-esophageal reflux disease without esophagitis: Secondary | ICD-10-CM

## 2016-03-04 MED ORDER — OMEPRAZOLE 20 MG PO CPDR: 20.0000 mg | DELAYED_RELEASE_CAPSULE | Freq: Every day | ORAL | 0 refills | Status: DC

## 2016-03-04 NOTE — ED Provider Notes (Signed)
[facility]    CSN: [id number] Arrival date & time: 03/04/16  1619     History   Chief Complaint Chief Complaint  Patient presents with  . Chest Pain    HPI Haley Glass is a 26 y.o. female.   Pt here for chest pain and burning in her chest over a month. sts worse after eating and she has to make her self throw up.  Symptoms are worsening over the past week.  No association with activity.  Symptoms are worse after eating.  She has a burning feeling in her esophagus.  Sometimes the discomfort is intermittent and sometimes it lasts for hours.  Nothing tried yet to relieve symptoms.   She also occasionally gets SOB when she's walking.  No h/o asthma.  She has stopped smoking recently.  Positive F/H asthma.  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 Heart murmur     There are no active problems to display for this patient.   History reviewed. No pertinent surgical history.  OB History    No data available       Home Medications    Prior to Admission medications   Medication Sig Start Date End Date Taking? Authorizing Provider  omeprazole (PRILOSEC) 20 MG capsule Take 1 capsule (20 mg total) by mouth daily. 03/04/16   Elvina SidleKurt Della Scrivener, MD    Family History History reviewed. No pertinent family history.  Social History Social History  Substance Use Topics  . Smoking status: Current Every Day Smoker  . Smokeless tobacco: Never Used  . Alcohol use Yes     Allergies   Amoxicillin and Penicillins   Review of Systems Review of Systems  Constitutional: Negative.   HENT: Negative.   Respiratory: Positive for chest tightness and shortness of breath.      Physical Exam Triage Vital Signs ED Triage Vitals  Enc Vitals Group     BP 03/04/16 1656 126/83     Pulse Rate 03/04/16 1656 64     Resp 03/04/16 1656 18     Temp 03/04/16 1656 98.8 F (37.1 C)     Temp src --     SpO2 03/04/16 1656 98 %     Weight --      Height --      Head Circumference --      Peak Flow --      Pain Score 03/04/16 1658 5     Pain Loc --      Pain Edu? --      Excl. in GC? --    No data found.   Updated Vital Signs BP 126/83   Pulse 64   Temp 98.8 F (37.1 C)   Resp 18   LMP 02/18/2016   SpO2 98%    Physical Exam  Constitutional: She is oriented to person, place, and time. She appears well-developed and well-nourished.  HENT:  Right Ear: External ear normal.  Left Ear: External ear normal.  Mouth/Throat: Oropharynx is clear and moist.  Eyes: Conjunctivae are normal.  Neck: Normal range of motion. Neck supple.  Cardiovascular: Normal rate, regular rhythm and normal heart sounds.   Pulmonary/Chest: Effort normal and breath sounds normal.  Musculoskeletal: Normal range of motion.  Neurological: She is alert and oriented to person, place, and time.  Skin: Skin is warm and dry.  Nursing note and vitals reviewed.  UC Treatments / Results  Labs (all labs ordered are listed, but only abnormal results are displayed) Labs Reviewed - No data to display  EKG  EKG Interpretation None       Radiology No results found.  Procedures Procedures (including critical care time)  Medications Ordered in UC Medications - No data to display   Initial Impression / Assessment and Plan / UC Course  I have reviewed the triage vital signs and the nursing notes.  Pertinent labs & imaging results that were available during my care of the patient were reviewed by me and considered in my medical decision making (see chart for details).     Final Clinical Impressions(s) / UC Diagnoses   Final diagnoses:  Gastroesophageal reflux disease without esophagitis    New Prescriptions New Prescriptions   OMEPRAZOLE (PRILOSEC) 20 MG CAPSULE    Take 1 capsule (20 mg total) by mouth daily.     Elvina Sidle, MD 03/04/16 [phone]

## 2016-03-04 NOTE — ED Triage Notes (Signed)
Pt here for chest pain and burning in her chest over a month. sts worse after eating and she has to make her self throw up.

## 2016-09-20 ENCOUNTER — Ambulatory Visit (HOSPITAL_COMMUNITY)
Admission: EM | Admit: 2016-09-20 | Discharge: 2016-09-20 | Disposition: A | Discharge status: Home / Self Care | Payer: BLUE CROSS/BLUE SHIELD | Attending: Internal Medicine | Admitting: Internal Medicine

## 2016-09-20 ENCOUNTER — Encounter (HOSPITAL_COMMUNITY): Payer: Self-pay | Admitting: Emergency Medicine

## 2016-09-20 DIAGNOSIS — K0889 Other specified disorders of teeth and supporting structures: Secondary | ICD-10-CM | POA: Diagnosis not present

## 2016-09-20 MED ORDER — CLINDAMYCIN HCL 150 MG PO CAPS
450.0000 mg | ORAL_CAPSULE | Freq: Three times a day (TID) | ORAL | 0 refills | Status: AC
Start: 2016-09-20 — End: 2016-09-27

## 2016-09-20 MED ORDER — NAPROXEN 500 MG PO TABS: 500.0000 mg | ORAL_TABLET | Freq: Two times a day (BID) | ORAL | 0 refills | Status: AC

## 2016-09-20 MED ORDER — BENZOCAINE 10 % MT GEL: 1.0000 "application " | OROMUCOSAL | 0 refills | Status: DC | PRN

## 2016-09-20 NOTE — Discharge Instructions (Signed)
Take clindamycin as directed for possible dental abscess. Naproxen and Orajel for dental pain. Follow-up with dentist as scheduled for further treatment needed. Monitor for any worsening of symptoms, swelling of the throat, trouble swallowing, trouble breathing, follow-up for reevaluation.

## 2016-09-20 NOTE — ED Provider Notes (Signed)
[facility]    CSN: [id number] Arrival date & time: 09/20/16  1322     History   Chief Complaint Chief Complaint  Patient presents with  . Dental Pain    HPI CLAIRISSA VALVANO is a 26 y.o. female.   26 year old female comes in for tooth pain. States it's from her right lower wisdom tooth, which she states it is growing sideways. She has an appointment with her dentist 10/01/2016 for removal, but she has started feeling increase throbbing pain, with facial swelling. She states it has now radiated to her right ear. She has not taken anything for the pain, and has not tried anything for the swelling. She denies fever, chills, night sweats. Denies trouble breathing, trouble swallowing, swelling or throat. Denies other URI symptoms such as cough, congestion, sore throat.      Past Medical History:  Diagnosis Date  . Heart murmur     There are no active problems to display for this patient.   History reviewed. No pertinent surgical history.  OB History    No data available       Home Medications    Prior to Admission medications   Medication Sig Start Date End Date Taking? Authorizing Provider  benzocaine (ORAJEL) 10 % mucosal gel Use as directed 1 application in the mouth or throat as needed for mouth pain. 09/20/16   Cathie Hoops, Zuri Bradway V, PA-C  clindamycin (CLEOCIN) 150 MG capsule Take 3 capsules (450 mg total) by mouth 3 (three) times daily. 09/20/16 09/27/16  Cathie Hoops, Arval Brandstetter V, PA-C  naproxen (NAPROSYN) 500 MG tablet Take 1 tablet (500 mg total) by mouth 2 (two) times daily. 09/20/16 09/30/16  Belinda Fisher, PA-C    Family History History reviewed. No pertinent family history.  Social History Social History  Substance Use Topics  . Smoking status: Current Every Day Smoker  . Smokeless tobacco: Never Used  . Alcohol use Yes     Allergies   Amoxicillin and Penicillins   Review of Systems Review of Systems  Reason unable to perform ROS: See HPI as above.     Physical  Exam Triage Vital Signs ED Triage Vitals [09/20/16 1333]  Enc Vitals Group     BP 118/81     Pulse Rate 86     Resp 18     Temp 98.4 F (36.9 C)     Temp Source Oral     SpO2 100 %     Weight      Height      Head Circumference      Peak Flow      Pain Score 5     Pain Loc      Pain Edu?      Excl. in GC?    No data found.   Updated Vital Signs BP 118/81 (BP Location: Right Arm)   Pulse 86   Temp 98.4 F (36.9 C) (Oral)   Resp 18   SpO2 100%    Physical Exam  Constitutional: She is oriented to person, place, and time. She appears well-developed and well-nourished. No distress.  HENT:  Head: Normocephalic and atraumatic.  Right Ear: Tympanic membrane, external ear and ear canal normal. Tympanic membrane is not erythematous and not bulging.  Left Ear: Tympanic membrane, external ear and ear canal normal. Tympanic membrane is not erythematous and not bulging.  Nose: Nose normal.  Mouth/Throat: Uvula is midline, oropharynx is clear and moist and mucous membranes are normal.  Tenderness on  palpation of gums on right lower molars. Specifically around right lower wisdom tooth. Swelling noted.   Pulmonary/Chest: Effort normal. No respiratory distress.  Neurological: She is alert and oriented to person, place, and time.  Skin: Skin is warm and dry.     UC Treatments / Results  Labs (all labs ordered are listed, but only abnormal results are displayed) Labs Reviewed - No data to display  EKG  EKG Interpretation None       Radiology No results found.  Procedures Procedures (including critical care time)  Medications Ordered in UC Medications - No data to display   Initial Impression / Assessment and Plan / UC Course  I have reviewed the triage vital signs and the nursing notes.  Pertinent labs & imaging results that were available during my care of the patient were reviewed by me and considered in my medical decision making (see chart for details).      Due to swelling and tenderness of the gums, will cover for dental abscess. Patient allergic to penicillin and amoxicillin, will cover with clindamycin. Naproxen and Orajel for dental pain. Offered Toradol injection, which patient states she does not think pain is enough to warrant an injection. Patient to follow-up with dentist as scheduled for further evaluation and treatment. Return precautions given.  Final Clinical Impressions(s) / UC Diagnoses   Final diagnoses:  Pain, dental    New Prescriptions New Prescriptions   BENZOCAINE (ORAJEL) 10 % MUCOSAL GEL    Use as directed 1 application in the mouth or throat as needed for mouth pain.   CLINDAMYCIN (CLEOCIN) 150 MG CAPSULE    Take 3 capsules (450 mg total) by mouth 3 (three) times daily.   NAPROXEN (NAPROSYN) 500 MG TABLET    Take 1 tablet (500 mg total) by mouth 2 (two) times daily.      Belinda Fisher, PA-C 09/20/16 1407

## 2016-09-20 NOTE — ED Triage Notes (Signed)
The patient presented to the [facility] with a complaint of right side dental pain and ear pain.

## 2018-10-13 ENCOUNTER — Ambulatory Visit: Payer: BLUE CROSS/BLUE SHIELD | Admitting: Neurology

## 2018-11-26 ENCOUNTER — Other Ambulatory Visit: Payer: Self-pay

## 2018-11-26 ENCOUNTER — Emergency Department (HOSPITAL_COMMUNITY)
Admission: EM | Admit: 2018-11-26 | Discharge: 2018-11-26 | Disposition: A | Discharge status: Home / Self Care | Payer: BC Managed Care – PPO | Attending: Emergency Medicine | Admitting: Emergency Medicine

## 2018-11-26 ENCOUNTER — Encounter (HOSPITAL_COMMUNITY): Payer: Self-pay

## 2018-11-26 DIAGNOSIS — Z5321 Procedure and treatment not carried out due to patient leaving prior to being seen by health care provider: Secondary | ICD-10-CM | POA: Insufficient documentation

## 2018-11-26 DIAGNOSIS — R519 Headache, unspecified: Secondary | ICD-10-CM | POA: Diagnosis not present

## 2018-11-26 NOTE — ED Triage Notes (Signed)
Pt presents with c/o headache that started last night but has since resolved. Pt reports she laid down today to relieve her headache and when she woke up she was feeling some numbness in the left side of her face. Pt c/o some numbness on the top of her back as well. Neurologically intact, no deficits, no chest pain, no other symptoms.

## 2019-08-30 ENCOUNTER — Emergency Department (HOSPITAL_COMMUNITY): Payer: BC Managed Care – PPO

## 2019-08-30 ENCOUNTER — Emergency Department (HOSPITAL_COMMUNITY)
Admission: EM | Admit: 2019-08-30 | Discharge: 2019-08-30 | Disposition: A | Discharge status: Home / Self Care | Payer: BC Managed Care – PPO | Attending: Emergency Medicine | Admitting: Emergency Medicine

## 2019-08-30 ENCOUNTER — Encounter (HOSPITAL_COMMUNITY): Payer: Self-pay | Admitting: Emergency Medicine

## 2019-08-30 DIAGNOSIS — Z5321 Procedure and treatment not carried out due to patient leaving prior to being seen by health care provider: Secondary | ICD-10-CM | POA: Diagnosis not present

## 2019-08-30 DIAGNOSIS — R0789 Other chest pain: Secondary | ICD-10-CM | POA: Insufficient documentation

## 2019-08-30 LAB — BASIC METABOLIC PANEL
Anion gap: 9 (ref 5–15)
BUN: 9 mg/dL (ref 6–20)
CO2: 25 mmol/L (ref 22–32)
Calcium: 8.7 mg/dL — ABNORMAL LOW (ref 8.9–10.3)
Chloride: 105 mmol/L (ref 98–111)
Creatinine, Ser: 0.79 mg/dL (ref 0.44–1.00)
GFR calc Af Amer: >60 mL/min (ref >60)
GFR calc non Af Amer: >60 mL/min (ref >60)
Glucose, Bld: 96 mg/dL (ref 70–99)
Potassium: 3.4 mmol/L — ABNORMAL LOW (ref 3.5–5.1)
Sodium: 139 mmol/L (ref 135–145)

## 2019-08-30 LAB — CBC
HCT: 39.1 % (ref 36.0–46.0)
Hemoglobin: 12.8 g/dL (ref 12.0–15.0)
MCH: 30.5 pg (ref 26.0–34.0)
MCHC: 32.7 g/dL (ref 30.0–36.0)
MCV: 93.3 fL (ref 80.0–100.0)
Platelets: 201 10*3/uL (ref 150–400)
RBC: 4.19 MIL/uL (ref 3.87–5.11)
RDW: 12.3 % (ref 11.5–15.5)
WBC: 7 10*3/uL (ref 4.0–10.5)
nRBC: 0 % (ref 0.0–0.2)

## 2019-08-30 LAB — TROPONIN I (HIGH SENSITIVITY): Troponin I (High Sensitivity): 3 ng/L (ref <18)

## 2019-08-30 MED ORDER — SODIUM CHLORIDE 0.9% FLUSH: 3.0000 mL | Freq: Once | INTRAVENOUS | Status: DC

## 2019-08-30 NOTE — ED Triage Notes (Signed)
Pt reports L sided chest pain that radiates down L arm. Pain began this morning. Denies sob, cough, fever or chills.

## 2020-10-30 ENCOUNTER — Ambulatory Visit: Payer: 59 | Admitting: Diagnostic Neuroimaging

## 2021-02-02 ENCOUNTER — Other Ambulatory Visit: Payer: Self-pay

## 2021-02-02 ENCOUNTER — Encounter (HOSPITAL_COMMUNITY): Payer: Self-pay | Admitting: Emergency Medicine

## 2021-02-02 ENCOUNTER — Emergency Department (HOSPITAL_COMMUNITY)
Admission: EM | Admit: 2021-02-02 | Discharge: 2021-02-02 | Disposition: A | Discharge status: Home / Self Care | Payer: 59 | Attending: Student | Admitting: Student

## 2021-02-02 DIAGNOSIS — H9202 Otalgia, left ear: Secondary | ICD-10-CM | POA: Insufficient documentation

## 2021-02-02 DIAGNOSIS — R519 Headache, unspecified: Secondary | ICD-10-CM | POA: Diagnosis present

## 2021-02-02 DIAGNOSIS — J0101 Acute recurrent maxillary sinusitis: Secondary | ICD-10-CM | POA: Diagnosis not present

## 2021-02-02 MED ORDER — FLUCONAZOLE 150 MG PO TABS: 150.0000 mg | ORAL_TABLET | Freq: Every day | ORAL | 0 refills | Status: AC

## 2021-02-02 MED ORDER — DOXYCYCLINE HYCLATE 100 MG PO CAPS: 100.0000 mg | ORAL_CAPSULE | Freq: Two times a day (BID) | ORAL | 0 refills | Status: AC

## 2021-02-02 MED ORDER — KETOROLAC TROMETHAMINE 15 MG/ML IJ SOLN
15.0000 mg | Freq: Once | INTRAMUSCULAR | Status: AC
  Administered 2021-02-02: 15 mg via INTRAMUSCULAR
  Filled 2021-02-02: qty 1

## 2021-02-02 MED ORDER — FLUCONAZOLE 150 MG PO TABS: 150.0000 mg | ORAL_TABLET | Freq: Every day | ORAL | 0 refills | Status: DC

## 2021-02-02 MED ORDER — TRIAMCINOLONE ACETONIDE 55 MCG/ACT NA AERO: 2.0000 | INHALATION_SPRAY | Freq: Every day | NASAL | 12 refills | Status: DC

## 2021-02-02 MED ORDER — MONTELUKAST SODIUM 10 MG PO TABS: 10.0000 mg | ORAL_TABLET | Freq: Every day | ORAL | 0 refills | Status: DC

## 2021-02-02 NOTE — ED Provider Notes (Signed)
[facility] DEPT Provider Note   CSN: [id number] Arrival date & time: 02/02/21  0754     History  Chief Complaint  Patient presents with   Headache   Otalgia    Haley Glass is a 31 y.o. female presenting for evaluation of left ear pain, sinus pressure.  Patient states for the past several months, she has had at least monthly symptoms of sinus pressure and congestion and ear pain.  She has been seen multiple times by her PCP, often placed on a course of prednisone and given antibiotics.  She has been given both Bactrim and clindamycin without improvement of symptoms.  Her most recent episode of this was December 20, she took the prednisone but did not take antibiotics at that time.  Symptoms resolved until yesterday when she developed recurring pain.  She also has associated headache.  She denies fevers yesterday or today.  Pain is always only in the left ear, however symptoms are present in bilateral sinuses.  No previous history of allergies or sinusitis.  She has no other medical problems, takes no medications daily.  She denies cough, sore throat, chest pain, shortness of breath, nausea, vomiting.  She does not take anything daily for her sinuses   HPI     Home Medications Prior to Admission medications   Medication Sig Start Date End Date Taking? Authorizing Provider  doxycycline (VIBRAMYCIN) 100 MG capsule Take 1 capsule (100 mg total) by mouth 2 (two) times daily for 10 days. 02/02/21 02/12/21 Yes Kenetha Cozza, PA-C  montelukast (SINGULAIR) 10 MG tablet Take 1 tablet (10 mg total) by mouth at bedtime. 02/02/21  Yes Dalton Molesworth, PA-C  triamcinolone (NASACORT) 55 MCG/ACT AERO nasal inhaler Place 2 sprays into the nose daily. 02/02/21  Yes Amneet Cendejas, PA-C  benzocaine (ORAJEL) 10 % mucosal gel Use as directed 1 application in the mouth or throat as needed for mouth pain. 09/20/16   Tasia Catchings, Amy V, PA-C      Allergies    Amoxicillin and  Penicillins    Review of Systems   Review of Systems  Constitutional:  Negative for fever.  HENT:  Positive for congestion, ear pain, postnasal drip, sinus pressure and sinus pain.    Physical Exam Updated Vital Signs BP 126/84 (BP Location: Right Arm)    Pulse 88    Temp 99 F (37.2 C) (Oral)    Resp 16    Ht 5\' 6"  (1.676 m)    Wt 88.5 kg    SpO2 100%    BMI 31.47 kg/m  Physical Exam Vitals and nursing note reviewed.  Constitutional:      General: She is not in acute distress.    Appearance: She is well-developed.     Comments: Resting in the bed in NAD  HENT:     Head: Normocephalic and atraumatic.     Right Ear: Tympanic membrane, ear canal and external ear normal.     Left Ear: Ear canal and external ear normal. A middle ear effusion is present.     Ears:     Comments: Fluid behind left TM, but no erythema or bulging.    Nose: Mucosal edema, congestion and rhinorrhea present. Rhinorrhea is clear.     Right Sinus: Maxillary sinus tenderness present.     Left Sinus: Maxillary sinus tenderness present.     Mouth/Throat:     Mouth: Mucous membranes are moist.     Pharynx: Uvula midline.  Tonsils: No tonsillar exudate.  Eyes:     Conjunctiva/sclera: Conjunctivae normal.     Pupils: Pupils are equal, round, and reactive to light.  Cardiovascular:     Rate and Rhythm: Normal rate and regular rhythm.     Pulses: Normal pulses.  Pulmonary:     Effort: Pulmonary effort is normal. No respiratory distress.     Breath sounds: Normal breath sounds. No decreased breath sounds, wheezing, rhonchi or rales.  Abdominal:     General: There is no distension.     Palpations: Abdomen is soft.     Tenderness: There is no abdominal tenderness.  Musculoskeletal:        General: Normal range of motion.     Cervical back: Normal range of motion.  Lymphadenopathy:     Cervical: No cervical adenopathy.  Skin:    General: Skin is warm.  Neurological:     Mental Status: She is alert and  oriented to person, place, and time.    ED Results / Procedures / Treatments   Labs (all labs ordered are listed, but only abnormal results are displayed) Labs Reviewed - No data to display  EKG None  Radiology No results found.  Procedures Procedures    Medications Ordered in ED Medications  ketorolac (TORADOL) 15 MG/ML injection 15 mg (has no administration in time range)    ED Course/ Medical Decision Making/ A&P                           Medical Decision Making   This patient presents to the ED for concern of sinus pressure, HA, L er pain. This involves an number number of treatment options, and is a complaint that carries with it a low risk of complications and morbidity.  The differential diagnosis includes sinusitis, ear infection, eustachian tube dysfunction, allergies, headache.  Medicines ordered:  I ordered medication including toradol  for HA  Dispostion:  After consideration of the diagnostic results and the patients response to treatment, I feel that the patent would benefit from outpatient management.  Discussed possible etiologies including sinusitis, eustachian tube dysfunction.  Discussed treatment with nasal spray, Singulair.  Offered antibiotics for sinus infection, patient would like to try these.  Discussed importance of taking the complete course of antibiotics if she starts them.  Offered prednisone, patient has some leftover at home.  Per chart review, patient has an appointment scheduled with ENT on January 18, informed patient of this appointment and encouraged her to keep it.  Information given for this clinic.  At this time, patient appears safe for discharge. Return precautions given.  Patient states she understands and agrees to plan.   Final Clinical Impression(s) / ED Diagnoses Final diagnoses:  Acute recurrent maxillary sinusitis  Left ear pain    Rx / DC Orders ED Discharge Orders          Ordered    triamcinolone (NASACORT) 55  MCG/ACT AERO nasal inhaler  Daily        02/02/21 0857    doxycycline (VIBRAMYCIN) 100 MG capsule  2 times daily        02/02/21 0857    montelukast (SINGULAIR) 10 MG tablet  Daily at bedtime        02/02/21 0857              Kaari Zeigler, PA-C 02/02/21 0924    Kommor, Debe Coder, MD 02/02/21 1558

## 2021-02-02 NOTE — ED Triage Notes (Signed)
Patient reports having issues with her ears since august. States she has gone every month for issues. Most recently given prednisone and antibiotics. Starting yesterday about 2:30 in afternoon having headache. Has appt with ENT in March.

## 2021-02-02 NOTE — Discharge Instructions (Signed)
Use the Singulair daily to decrease mucus production. Use the nasal spray daily to decrease inflammation in the nasal passage and help drainage severe pain is not so severe. Take doxycycline as prescribed.  If you start the doxycycline, it is extremely important that you take the entire course. Use Tylenol or ibuprofen as needed for fever or pain. Call the ENT office listed below to confirm or set up a follow-up appointment. Return to the emergency room with any new, worsening, or concerning symptoms.

## 2022-06-10 ENCOUNTER — Ambulatory Visit (HOSPITAL_COMMUNITY)
Admission: EM | Admit: 2022-06-10 | Discharge: 2022-06-10 | Disposition: A | Discharge status: Home / Self Care | Payer: BC Managed Care – PPO | Attending: Internal Medicine | Admitting: Internal Medicine

## 2022-06-10 ENCOUNTER — Encounter (HOSPITAL_COMMUNITY): Payer: Self-pay | Admitting: *Deleted

## 2022-06-10 ENCOUNTER — Other Ambulatory Visit: Payer: Self-pay

## 2022-06-10 DIAGNOSIS — J069 Acute upper respiratory infection, unspecified: Secondary | ICD-10-CM | POA: Diagnosis not present

## 2022-06-10 DIAGNOSIS — R519 Headache, unspecified: Secondary | ICD-10-CM

## 2022-06-10 MED ORDER — METHYLPREDNISOLONE SODIUM SUCC 125 MG IJ SOLR
INTRAMUSCULAR | Status: AC
  Filled 2022-06-10: qty 2

## 2022-06-10 MED ORDER — BENZONATATE 100 MG PO CAPS: 100.0000 mg | ORAL_CAPSULE | Freq: Three times a day (TID) | ORAL | 0 refills | Status: DC

## 2022-06-10 MED ORDER — METHYLPREDNISOLONE SODIUM SUCC 125 MG IJ SOLR
80.0000 mg | Freq: Once | INTRAMUSCULAR | Status: AC
  Administered 2022-06-10: 80 mg via INTRAMUSCULAR

## 2022-06-10 MED ORDER — PROMETHAZINE-DM 6.25-15 MG/5ML PO SYRP: 5.0000 mL | ORAL_SOLUTION | Freq: Every evening | ORAL | 0 refills | Status: DC | PRN

## 2022-06-10 NOTE — ED Triage Notes (Signed)
Pt was on a plane last week and developed a cough ,Ha,congestion .

## 2022-06-10 NOTE — Discharge Instructions (Addendum)
You have a viral upper respiratory infection.   Use the following medicines to help with symptoms: - Plain Mucinex (guaifenesin) over the counter as directed every 12 hours to thin mucous so that you are able to get it out of your body easier. Drink plenty of water while taking this medication so that it works well in your body (at least 8 cups a day).  - Tylenol 1,000mg and/or ibuprofen 600mg every 6 hours with food as needed for aches/pains or fever/chills.  - Tessalon perles every 8 hours as needed for cough. - Take Promethazine DM cough medication to help with your cough at nighttime so that you are able to sleep. Do not drive, drink alcohol, or go to work while taking this medication since it can make you sleepy. Only take this at nighttime.   1 tablespoon of honey in warm water and/or salt water gargles may also help with symptoms. Humidifier to your room will help add water to the air and reduce coughing.  If you develop any new or worsening symptoms, please return.  If your symptoms are severe, please go to the emergency room.  Follow-up with your primary care provider for further evaluation and management of your symptoms as well as ongoing wellness visits.  I hope you feel better!  

## 2022-06-10 NOTE — ED Provider Notes (Signed)
[facility]    CSN: [id number] Arrival date & time: 06/10/22  1154      History   Chief Complaint Chief Complaint  Patient presents with   Fever   Nasal Congestion   Cough   Headache    HPI Haley Glass is a 32 y.o. female.   Patient presents to urgent care for evaluation of cough, congestion, sore throat, headache, and fever/chills that started 2 days ago. At home COVID tests (4) are negative. Cough is productive (dark green sputum) and nasal congestion is yellow. Yesterday fever at 101.6 but responded well to antipyretic. Headache is generalized and currently a 7 on a scale of 0-10. History of migraines, this headache is bilateral and crosses midline. Currently no photophobia or phonophobia, dizziness, nausea, vomiting, abdominal pain, rash, or lightheadedness. No shortness of breath, heart palpitations, or chest pain. Complains of "stopped up ears" as well and decreased hearing. States her ears "popped" after flying. Traveled to [facility] 5 days ago and traveled back 2 days ago when symptoms worsened. Pressure to the ears is worse with swallowing. No history of chronic respiratory problems. Intermittent cigarette smoker, has not smoked since becoming sick, denies other drug use. Using theraflu and mucinex without much relief. Currently afebrile, last dose of aleve was 3am this morning. States she usually gets a "steroid shot" to help with head pain when she gets sick since illness usually triggers migraine. States she got her tragus pierced and she hasn't had migraine in "years" other than when she gets sick.    Fever Associated symptoms: cough and headaches   Cough Associated symptoms: fever and headaches   Headache Associated symptoms: cough and fever     Past Medical History:  Diagnosis Date   Heart murmur     There are no problems to display for this patient.   History reviewed. No pertinent surgical history.  OB History   No obstetric history on  file.      Home Medications    Prior to Admission medications   Medication Sig Start Date End Date Taking? Authorizing Provider  benzonatate (TESSALON) 100 MG capsule Take 1 capsule (100 mg total) by mouth every 8 (eight) hours. 06/10/22  Yes Carlisle Beers, FNP  promethazine-dextromethorphan (PROMETHAZINE-DM) 6.25-15 MG/5ML syrup Take 5 mLs by mouth at bedtime as needed for cough. 06/10/22  Yes Carlisle Beers, FNP  benzocaine (ORAJEL) 10 % mucosal gel Use as directed 1 application in the mouth or throat as needed for mouth pain. 09/20/16   Cathie Hoops, Amy V, PA-C  montelukast (SINGULAIR) 10 MG tablet Take 1 tablet (10 mg total) by mouth at bedtime. 02/02/21   Caccavale, Sophia, PA-C  triamcinolone (NASACORT) 55 MCG/ACT AERO nasal inhaler Place 2 sprays into the nose daily. 02/02/21   Caccavale, Sophia, PA-C    Family History History reviewed. No pertinent family history.  Social History Social History   Tobacco Use   Smoking status: Every Day   Smokeless tobacco: Never  Substance Use Topics   Alcohol use: Yes   Drug use: No     Allergies   Amoxicillin and Penicillins   Review of Systems Review of Systems  Constitutional:  Positive for fever.  Respiratory:  Positive for cough.   Neurological:  Positive for headaches.  Per HPI   Physical Exam Triage Vital Signs ED Triage Vitals  Enc Vitals Group     BP 06/10/22 1228 120/87     Pulse Rate 06/10/22 1228 80  Resp 06/10/22 1228 16     Temp 06/10/22 1228 98.7 F (37.1 C)     Temp src --      SpO2 06/10/22 1228 96 %     Weight --      Height --      Head Circumference --      Peak Flow --      Pain Score 06/10/22 1225 8     Pain Loc --      Pain Edu? --      Excl. in GC? --    No data found.  Updated Vital Signs BP 120/87   Pulse 80   Temp 98.7 F (37.1 C)   Resp 16   LMP 05/04/2022 (Approximate)   SpO2 96%   Visual Acuity Right Eye Distance:   Left Eye Distance:   Bilateral Distance:    Right  Eye Near:   Left Eye Near:    Bilateral Near:     Physical Exam Vitals and nursing note reviewed.  Constitutional:      Appearance: She is not ill-appearing or toxic-appearing.  HENT:     Head: Normocephalic and atraumatic.     Right Ear: Hearing, ear canal and external ear normal. No drainage, swelling or tenderness. A middle ear effusion is present. Tympanic membrane is not perforated.     Left Ear: Hearing, ear canal and external ear normal. No drainage, swelling or tenderness. A middle ear effusion is present. Tympanic membrane is not perforated.     Nose: Nose normal.     Mouth/Throat:     Lips: Pink.     Mouth: Mucous membranes are moist. No injury.     Tongue: No lesions. Tongue does not deviate from midline.     Palate: No mass and lesions.     Pharynx: Oropharynx is clear. Uvula midline. No pharyngeal swelling, oropharyngeal exudate, posterior oropharyngeal erythema or uvula swelling.     Tonsils: No tonsillar exudate or tonsillar abscesses.     Comments: Small amount of clear postnasal drainage visualized to the posterior oropharynx.  Eyes:     General: Lids are normal. Vision grossly intact. Gaze aligned appropriately.     Extraocular Movements: Extraocular movements intact.     Conjunctiva/sclera: Conjunctivae normal.  Cardiovascular:     Rate and Rhythm: Normal rate and regular rhythm.     Heart sounds: Normal heart sounds, S1 normal and S2 normal.  Pulmonary:     Effort: Pulmonary effort is normal. No respiratory distress.     Breath sounds: Normal breath sounds and air entry. No decreased breath sounds, wheezing, rhonchi or rales.  Musculoskeletal:     Cervical back: Neck supple.  Skin:    General: Skin is warm and dry.     Capillary Refill: Capillary refill takes less than 2 seconds.     Findings: No rash.  Neurological:     General: No focal deficit present.     Mental Status: She is alert and oriented to person, place, and time. Mental status is at baseline.      Cranial Nerves: Cranial nerves 2-12 are intact. No dysarthria or facial asymmetry.     Sensory: Sensation is intact.     Motor: Motor function is intact.     Coordination: Coordination is intact.     Gait: Gait is intact.  Psychiatric:        Mood and Affect: Mood normal.        Speech: Speech normal.  Behavior: Behavior normal.        Thought Content: Thought content normal.        Judgment: Judgment normal.      UC Treatments / Results  Labs (all labs ordered are listed, but only abnormal results are displayed) Labs Reviewed - No data to display  EKG   Radiology No results found.  Procedures Procedures (including critical care time)  Medications Ordered in UC Medications  methylPREDNISolone sodium succinate (SOLU-MEDROL) 125 mg/2 mL injection 80 mg (80 mg Intramuscular Given 06/10/22 1311)    Initial Impression / Assessment and Plan / UC Course  I have reviewed the triage vital signs and the nursing notes.  Pertinent labs & imaging results that were available during my care of the patient were reviewed by me and considered in my medical decision making (see chart for details).   1. Viral URI with cough, bad headache Symptoms and physical exam consistent with a viral upper respiratory tract infection that will likely resolve with rest, fluids, and prescriptions for symptomatic relief. Deferred imaging based on stable cardiopulmonary exam and hemodynamically stable vital signs. Deferred viral testing using shared decision making as patient is low risk for severe disease related to potential COVID-19 illness and there is low suspicion for influenza. Discussed updated CDC guidelines for masking related to COVID-19.   Patient given solumedrol 80mg  IM in clinic today for headache. Deferred ketorolac injection due to recent aleve this moring. Aleve has not been helping with headache and she states steroid usually aborts headache. Does not take any abortive medications  for migraines at home. May continue aleve as needed. Neurologically intact to baseline.  Tessalon perles and promethazine DM sent to pharmacy for symptomatic relief to be taken as prescribed.  May continue taking over the counter medications as directed for further symptomatic relief.  Drowsiness precautions discussed regarding promethazine DM prescription.  Nonpharmacologic interventions for symptom relief provided and after visit summary below. Advised to push fluids to stay well hydrated while recovering from viral illness.   Discussed physical exam and available lab work findings in clinic with patient.  Counseled patient regarding appropriate use of medications and potential side effects for all medications recommended or prescribed today. Discussed red flag signs and symptoms of worsening condition,when to call the PCP office, return to urgent care, and when to seek higher level of care in the emergency department. Patient verbalizes understanding and agreement with plan. All questions answered. Patient discharged in stable condition.    Final Clinical Impressions(s) / UC Diagnoses   Final diagnoses:  Viral URI with cough  Bad headache     Discharge Instructions      You have a viral upper respiratory infection.   Use the following medicines to help with symptoms: - Plain Mucinex (guaifenesin) over the counter as directed every 12 hours to thin mucous so that you are able to get it out of your body easier. Drink plenty of water while taking this medication so that it works well in your body (at least 8 cups a day).  - Tylenol 1,000mg  and/or ibuprofen 600mg  every 6 hours with food as needed for aches/pains or fever/chills.  - Tessalon perles every 8 hours as needed for cough. - Take Promethazine DM cough medication to help with your cough at nighttime so that you are able to sleep. Do not drive, drink alcohol, or go to work while taking this medication since it can make you sleepy. Only  take this at nighttime.   1  tablespoon of honey in warm water and/or salt water gargles may also help with symptoms. Humidifier to your room will help add water to the air and reduce coughing.  If you develop any new or worsening symptoms, please return.  If your symptoms are severe, please go to the emergency room.  Follow-up with your primary care provider for further evaluation and management of your symptoms as well as ongoing wellness visits.  I hope you feel better!      ED Prescriptions     Medication Sig Dispense Auth. Provider   benzonatate (TESSALON) 100 MG capsule Take 1 capsule (100 mg total) by mouth every 8 (eight) hours. 21 capsule Carlisle Beers, FNP   promethazine-dextromethorphan (PROMETHAZINE-DM) 6.25-15 MG/5ML syrup Take 5 mLs by mouth at bedtime as needed for cough. 118 mL Carlisle Beers, FNP      PDMP not reviewed this encounter.   Carlisle Beers, [place] 06/10/22 1315

## 2022-10-24 ENCOUNTER — Ambulatory Visit
Admission: EM | Admit: 2022-10-24 | Discharge: 2022-10-24 | Disposition: A | Discharge status: Home / Self Care | Payer: BC Managed Care – PPO | Attending: Internal Medicine | Admitting: Internal Medicine

## 2022-10-24 DIAGNOSIS — Z1152 Encounter for screening for COVID-19: Secondary | ICD-10-CM | POA: Diagnosis not present

## 2022-10-24 DIAGNOSIS — B349 Viral infection, unspecified: Secondary | ICD-10-CM | POA: Diagnosis present

## 2022-10-24 DIAGNOSIS — F17290 Nicotine dependence, other tobacco product, uncomplicated: Secondary | ICD-10-CM | POA: Insufficient documentation

## 2022-10-24 DIAGNOSIS — F17210 Nicotine dependence, cigarettes, uncomplicated: Secondary | ICD-10-CM | POA: Diagnosis not present

## 2022-10-24 MED ORDER — PROMETHAZINE-DM 6.25-15 MG/5ML PO SYRP: 5.0000 mL | ORAL_SOLUTION | Freq: Three times a day (TID) | ORAL | 0 refills | Status: DC | PRN

## 2022-10-24 MED ORDER — PSEUDOEPHEDRINE HCL 30 MG PO TABS: 30.0000 mg | ORAL_TABLET | Freq: Three times a day (TID) | ORAL | 0 refills | Status: DC | PRN

## 2022-10-24 MED ORDER — CETIRIZINE HCL 10 MG PO TABS: 10.0000 mg | ORAL_TABLET | Freq: Every day | ORAL | 0 refills | Status: AC

## 2022-10-24 MED ORDER — DEXAMETHASONE SODIUM PHOSPHATE 10 MG/ML IJ SOLN
10.0000 mg | Freq: Once | INTRAMUSCULAR | Status: AC
  Administered 2022-10-24: 10 mg via INTRAMUSCULAR

## 2022-10-24 NOTE — ED Triage Notes (Signed)
Pt reports left sided sinus pressure, congestion, drainage x 1 day. Mucinex gives some relief. Pt think this is related to a cigarette she smoked this week.

## 2022-10-24 NOTE — Discharge Instructions (Signed)
We will notify you of your test results as they arrive and may take between about 24 hours.  I encourage you to sign up for MyChart if you have not already done so as this can be the easiest way for [place] to communicate results to you online or through a phone app.  Generally, we only contact you if it is a positive test result.  In the meantime, if you develop worsening symptoms including fever, chest pain, shortness of breath despite our current treatment plan then please report to the emergency room as this may be a sign of worsening status from possible viral infection.  Otherwise, we will manage this as a viral syndrome. For sore throat or cough try using a honey-based tea. Use 3 teaspoons of honey with juice squeezed from half lemon. Place shaved pieces of ginger into 1/2-1 cup of water and warm over stove top. Then mix the ingredients and repeat every 4 hours as needed. Please take Tylenol 650mg  every 6 hours for aches and pains, fevers. Hydrate very well with at least 2 liters of water. Eat light meals such as soups to replenish electrolytes and soft fruits, veggies. Start an antihistamine like Zyrtec (10mg  daily) for postnasal drainage, sinus congestion.  You can take this together with pseudoephedrine (Sudafed) at a dose of 30 mg 2-3 times a day as needed for the same kind of congestion.  Use the cough medications as needed.

## 2022-10-24 NOTE — ED Provider Notes (Signed)
[facility]  Note:  This document was prepared using Conservation officer, historic buildings and may include unintentional dictation errors.  MRN: [id number] DOB: Jan 04, 1991  Subjective:   Haley Glass is a 32 y.o. female presenting for 1 day history of left-sided sinus pressure, congestion, drainage, sensation the mucus is now in her chest.  Smokes cigars regularly.  Smoked a cigarette this past week and ever since then has felt sinus symptoms.  No history of asthma.  No overt shortness of breath, wheezing.  No chronic medications.    Allergies  Allergen Reactions   Amoxicillin Anaphylaxis   Penicillins Anaphylaxis and Other (See Comments)   Metronidazole Other (See Comments)    Past Medical History:  Diagnosis Date   Heart murmur      History reviewed. No pertinent surgical history.  History reviewed. No pertinent family history.  Social History   Tobacco Use   Smoking status: Every Day    Types: Cigarettes   Smokeless tobacco: Never  Substance Use Topics   Alcohol use: Yes   Drug use: No    ROS   Objective:   Vitals: BP (!) 139/91 (BP Location: Right Arm)   Pulse 84   Temp 98.5 F (36.9 C) (Oral)   Resp 16   LMP 10/11/2022 (Exact Date)   SpO2 97%   Physical Exam Constitutional:      General: She is not in acute distress.    Appearance: Normal appearance. She is well-developed and normal weight. She is not ill-appearing, toxic-appearing or diaphoretic.  HENT:     Head: Normocephalic and atraumatic.     Right Ear: Tympanic membrane, ear canal and external ear normal. No drainage or tenderness. No middle ear effusion. There is no impacted cerumen. Tympanic membrane is not erythematous or bulging.     Left Ear: Tympanic membrane, ear canal and external ear normal. No drainage or tenderness.  No middle ear effusion. There is no impacted cerumen. Tympanic membrane is not erythematous or bulging.     Nose: Nose normal. No congestion  or rhinorrhea.     Mouth/Throat:     Mouth: Mucous membranes are moist. No oral lesions.     Pharynx: No pharyngeal swelling, oropharyngeal exudate, posterior oropharyngeal erythema or uvula swelling.     Tonsils: No tonsillar exudate or tonsillar abscesses.  Eyes:     General: No scleral icterus.       Right eye: No discharge.        Left eye: No discharge.     Extraocular Movements: Extraocular movements intact.     Right eye: Normal extraocular motion.     Left eye: Normal extraocular motion.     Conjunctiva/sclera: Conjunctivae normal.  Cardiovascular:     Rate and Rhythm: Normal rate and regular rhythm.     Heart sounds: Normal heart sounds. No murmur heard.    No friction rub. No gallop.  Pulmonary:     Effort: Pulmonary effort is normal. No respiratory distress.     Breath sounds: No stridor. No wheezing, rhonchi or rales.  Chest:     Chest wall: No tenderness.  Musculoskeletal:     Cervical back: Normal range of motion and neck supple.  Lymphadenopathy:     Cervical: No cervical adenopathy.  Skin:    General: Skin is warm and dry.  Neurological:     General: No focal deficit present.     Mental Status: She is alert and oriented to person, place, and  time.  Psychiatric:        Mood and Affect: Mood normal.        Behavior: Behavior normal.     Assessment and Plan :   PDMP not reviewed this encounter.  1. Acute viral syndrome    Discussed antibiotic stewardship, patient is agreeable to this.  Given her respiratory symptoms, regular smoking, recommended steroids which she has done well with in the past.  Patient prefers steroid injection.  IM dexamethasone 10 mg administered in clinic.  COVID testing pending.  Recommend general supportive care for an acute viral syndrome. Counseled patient on potential for adverse effects with medications prescribed/recommended today, ER and return-to-clinic precautions discussed, patient verbalized understanding.    Wallis Bamberg,  [place] 10/24/22 [phone]

## 2022-10-25 LAB — SARS CORONAVIRUS 2 (TAT 6-24 HRS): SARS Coronavirus 2: NEGATIVE

## 2022-12-28 ENCOUNTER — Encounter (HOSPITAL_COMMUNITY): Payer: Self-pay

## 2022-12-28 ENCOUNTER — Other Ambulatory Visit: Payer: Self-pay

## 2022-12-28 ENCOUNTER — Emergency Department (HOSPITAL_COMMUNITY)
Admission: EM | Admit: 2022-12-28 | Discharge: 2022-12-28 | Disposition: A | Discharge status: Home / Self Care | Payer: BC Managed Care – PPO | Attending: Emergency Medicine | Admitting: Emergency Medicine

## 2022-12-28 ENCOUNTER — Emergency Department (HOSPITAL_BASED_OUTPATIENT_CLINIC_OR_DEPARTMENT_OTHER): Payer: BC Managed Care – PPO

## 2022-12-28 DIAGNOSIS — R1030 Lower abdominal pain, unspecified: Secondary | ICD-10-CM | POA: Insufficient documentation

## 2022-12-28 DIAGNOSIS — M79605 Pain in left leg: Secondary | ICD-10-CM

## 2022-12-28 DIAGNOSIS — S8012XA Contusion of left lower leg, initial encounter: Secondary | ICD-10-CM | POA: Insufficient documentation

## 2022-12-28 DIAGNOSIS — S8992XA Unspecified injury of left lower leg, initial encounter: Secondary | ICD-10-CM | POA: Diagnosis present

## 2022-12-28 DIAGNOSIS — M7989 Other specified soft tissue disorders: Secondary | ICD-10-CM

## 2022-12-28 DIAGNOSIS — X58XXXA Exposure to other specified factors, initial encounter: Secondary | ICD-10-CM | POA: Insufficient documentation

## 2022-12-28 DIAGNOSIS — T148XXA Other injury of unspecified body region, initial encounter: Secondary | ICD-10-CM

## 2022-12-28 LAB — COMPREHENSIVE METABOLIC PANEL
ALT: 14 U/L (ref 0–44)
AST: 22 U/L (ref 15–41)
Albumin: 4.1 g/dL (ref 3.5–5.0)
Alkaline Phosphatase: 53 U/L (ref 38–126)
Anion gap: 5 (ref 5–15)
BUN: 7 mg/dL (ref 6–20)
CO2: 25 mmol/L (ref 22–32)
Calcium: 8.2 mg/dL — ABNORMAL LOW (ref 8.9–10.3)
Chloride: 106 mmol/L (ref 98–111)
Creatinine, Ser: 0.9 mg/dL (ref 0.44–1.00)
GFR, Estimated: >60 mL/min (ref >60)
Glucose, Bld: 96 mg/dL (ref 70–99)
Potassium: 3.4 mmol/L — ABNORMAL LOW (ref 3.5–5.1)
Sodium: 136 mmol/L (ref 135–145)
Total Bilirubin: 0.6 mg/dL (ref <1.2)
Total Protein: 7.2 g/dL (ref 6.5–8.1)

## 2022-12-28 LAB — CBC WITH DIFFERENTIAL/PLATELET
Abs Immature Granulocytes: 0.01 10*3/uL (ref 0.00–0.07)
Basophils Absolute: 0 10*3/uL (ref 0.0–0.1)
Basophils Relative: 0 %
Eosinophils Absolute: 0.1 10*3/uL (ref 0.0–0.5)
Eosinophils Relative: 1 %
HCT: 40.4 % (ref 36.0–46.0)
Hemoglobin: 13.1 g/dL (ref 12.0–15.0)
Immature Granulocytes: 0 %
Lymphocytes Relative: 33 %
Lymphs Abs: 2.4 10*3/uL (ref 0.7–4.0)
MCH: 30.2 pg (ref 26.0–34.0)
MCHC: 32.4 g/dL (ref 30.0–36.0)
MCV: 93.1 fL (ref 80.0–100.0)
Monocytes Absolute: 0.4 10*3/uL (ref 0.1–1.0)
Monocytes Relative: 6 %
Neutro Abs: 4.2 10*3/uL (ref 1.7–7.7)
Neutrophils Relative %: 60 %
Platelets: 201 10*3/uL (ref 150–400)
RBC: 4.34 MIL/uL (ref 3.87–5.11)
RDW: 12.5 % (ref 11.5–15.5)
WBC: 7.2 10*3/uL (ref 4.0–10.5)
nRBC: 0 % (ref 0.0–0.2)

## 2022-12-28 LAB — URINALYSIS, ROUTINE W REFLEX MICROSCOPIC
Bilirubin Urine: NEGATIVE
Glucose, UA: NEGATIVE mg/dL
Hgb urine dipstick: NEGATIVE
Ketones, ur: NEGATIVE mg/dL
Leukocytes,Ua: NEGATIVE
Nitrite: NEGATIVE
Protein, ur: NEGATIVE mg/dL
Specific Gravity, Urine: 1.012 (ref 1.005–1.030)
pH: 6 (ref 5.0–8.0)

## 2022-12-28 LAB — LIPASE, BLOOD: Lipase: 38 U/L (ref 11–51)

## 2022-12-28 LAB — HCG, SERUM, QUALITATIVE: Preg, Serum: NEGATIVE

## 2022-12-28 NOTE — Discharge Instructions (Signed)
The test today in the ED were reassuring.  Your blood tests are normal.  The ultrasound does not show any blood clot.  Follow-up with your doctor to be rechecked.

## 2022-12-28 NOTE — ED Triage Notes (Addendum)
Pt presenting today complaining of pain in both legs, but mostly in left leg. Pt also noticed bruising on her legs, denies bumping her legs or hitting it on anything that would explain the bruising. Today pt woke up and noticed they appears to be swelling too. Concerned that she may have a blood clot since she has a family hx of them.

## 2022-12-28 NOTE — ED Provider Notes (Addendum)
[place] EMERGENCY DEPARTMENT AT [facility] Provider Note   CSN: [id number] Arrival date & time: 12/28/22  1150     History  Chief Complaint  Patient presents with   Leg Pain    Haley Glass is a 32 y.o. female.   Leg Pain    Patient has a history of irregular menstrual periods.  Patient has been seeing an OB/GYN doctor.  She has had ultrasounds and is scheduled for repeat evaluation but has not had that yet.  Patient however comes to the ED with complaints of leg swelling and bruising.  Patient states she has noticed a heavy sensation in her left leg.  It also has felt more swollen than her right.  This morning she noticed she had several small areas of bruising on her leg and does not recall any specific injury.  Patient states there is a family history of DVT and she was concerned this was what she had on.  Patient also reports having some persistent abdominal pressure in her lower abdomen for the last couple weeks.  She has not any vomiting or diarrhea.  She has been having some issues with her menstrual periods and has been seeing an OB/GYN doctor but was not sure if this was related to her bruising issue    patient denies any occasions no enzymes Home Medications Prior to Admission medications   Medication Sig Start Date End Date Taking? Authorizing Provider  cetirizine (ZYRTEC ALLERGY) 10 MG tablet Take 1 tablet (10 mg total) by mouth daily. 10/24/22  Yes Wallis Bamberg, PA-C  dextromethorphan-guaiFENesin [facility] DM) 30-600 MG 12hr tablet Take 1 tablet by mouth 2 (two) times daily.   Yes [provider]  pseudoephedrine (SUDAFED) 30 MG tablet Take 1 tablet (30 mg total) by mouth every 8 (eight) hours as needed for congestion. 10/24/22  Yes Wallis Bamberg, PA-C      Allergies    Amoxicillin, Penicillins, and Metronidazole    Review of Systems   Review of Systems  Physical Exam Updated Vital Signs BP 124/87   Pulse 61   Temp 98.3 F (36.8 C)  (Oral)   Resp 18   Ht 1.676 m (5\' 6" )   Wt 97.5 kg   LMP 12/02/2022 (Exact Date)   SpO2 100%   BMI 34.70 kg/m  Physical Exam Vitals and nursing note reviewed.  Constitutional:      General: She is not in acute distress.    Appearance: She is well-developed.  HENT:     Head: Normocephalic and atraumatic.     Right Ear: External ear normal.     Left Ear: External ear normal.  Eyes:     General: No scleral icterus.       Right eye: No discharge.        Left eye: No discharge.     Conjunctiva/sclera: Conjunctivae normal.  Neck:     Trachea: No tracheal deviation.  Cardiovascular:     Rate and Rhythm: Normal rate and regular rhythm.  Pulmonary:     Effort: Pulmonary effort is normal. No respiratory distress.     Breath sounds: Normal breath sounds. No stridor. No wheezing or rales.  Abdominal:     General: Bowel sounds are normal. There is no distension.     Palpations: Abdomen is soft.     Tenderness: There is no abdominal tenderness. There is no guarding or rebound.     Comments: Mild tenderness suprapubic region  Musculoskeletal:  General: No tenderness or deformity.     Cervical back: Neck supple.     Comments: Mild edema noted left lower extremity, few faint areas of ecchymoses on her upper and lower leg, approximately few centimeters in size  Skin:    General: Skin is warm and dry.     Findings: No rash.     Comments: No bruising noted upper extremities  Neurological:     General: No focal deficit present.     Mental Status: She is alert.     Cranial Nerves: No cranial nerve deficit, dysarthria or facial asymmetry.     Sensory: No sensory deficit.     Motor: No abnormal muscle tone or seizure activity.     Coordination: Coordination normal.  Psychiatric:        Mood and Affect: Mood normal.     ED Results / Procedures / Treatments   Labs (all labs ordered are listed, but only abnormal results are displayed) Labs Reviewed  COMPREHENSIVE METABOLIC PANEL  - Abnormal; Notable for the following components:      Result Value   Potassium 3.4 (*)    Calcium 8.2 (*)    All other components within normal limits  LIPASE, BLOOD  CBC WITH DIFFERENTIAL/PLATELET  URINALYSIS, ROUTINE W REFLEX MICROSCOPIC  HCG, SERUM, QUALITATIVE    EKG None  Radiology VAS [place] LOWER EXTREMITY VENOUS (DVT) (7a-7p)  Result Date: 12/28/2022  Lower Venous DVT Study Patient Name:  Haley Glass  Date of Exam:   12/28/2022 Medical Rec #: [id number]          Accession #:    [id number] Date of Birth: [birth year]         Patient Gender: F Patient Age:   32 years Exam Location:  [facility] [place] Procedure:      VAS [place] LOWER EXTREMITY VENOUS (DVT) Referring Phys: Hermie Reagor --------------------------------------------------------------------------------  Indications: Pain, Swelling, and Edema.  Comparison Study: No prior exam. Performing Technologist: Fernande Bras  Examination Guidelines: A complete evaluation includes B-mode imaging, spectral Doppler, color Doppler, and power Doppler as needed of all accessible portions of each vessel. Bilateral testing is considered an integral part of a complete examination. Limited examinations for reoccurring indications may be performed as noted. The reflux portion of the exam is performed with the patient in reverse Trendelenburg.  +-----+---------------+---------+-----------+----------+--------------+ RIGHTCompressibilityPhasicitySpontaneityPropertiesThrombus Aging +-----+---------------+---------+-----------+----------+--------------+ CFV  Full           Yes      Yes                                 +-----+---------------+---------+-----------+----------+--------------+   +---------+---------------+---------+-----------+----------+--------------+ LEFT     CompressibilityPhasicitySpontaneityPropertiesThrombus Aging +---------+---------------+---------+-----------+----------+--------------+ CFV      Full           Yes       Yes                                 +---------+---------------+---------+-----------+----------+--------------+ SFJ      Full                                                        +---------+---------------+---------+-----------+----------+--------------+ FV Prox  Full                                                        +---------+---------------+---------+-----------+----------+--------------+  FV Mid   Full                                                        +---------+---------------+---------+-----------+----------+--------------+ FV DistalFull                                                        +---------+---------------+---------+-----------+----------+--------------+ PFV      Full                                                        +---------+---------------+---------+-----------+----------+--------------+ POP      Full           Yes      Yes                                 +---------+---------------+---------+-----------+----------+--------------+ PTV      Full                                                        +---------+---------------+---------+-----------+----------+--------------+ PERO     Full                                                        +---------+---------------+---------+-----------+----------+--------------+     Summary: RIGHT: - No evidence of common femoral vein obstruction.   LEFT: - There is no evidence of deep vein thrombosis in the lower extremity.  - No cystic structure found in the popliteal fossa.  *See table(s) above for measurements and observations.    Preliminary     Procedures Procedures    Medications Ordered in ED Medications - No data to display  ED Course/ Medical Decision Making/ A&P Clinical Course as of 12/28/22 1512  Mon Dec 28, 2022  1315 CBC with Diff CBC normal [JK]  1358 Metabolic panel normal.  Pregnancy test negative.  Lipase normal [JK]  1500 Doppler study negative  for DVT [JK]  1510 urinalysis without signs of infection [JK]    Clinical Course User Index [JK] Linwood Dibbles, MD                                 Medical Decision Making Problems Addressed: Bruising: acute illness or injury Left leg pain: acute illness or injury that poses a threat to life or bodily functions  Amount and/or Complexity of Data Reviewed Labs: ordered. Decision-making details documented in ED Course. Radiology: ordered and independent interpretation performed.   Patient presented to ED for evaluation of leg discomfort as well as easy bruising.  Patient does  not have any bruising elsewhere.  No ecchymoses noted in upper upper extremities or torso.  ED workup reassuring.  She is not anemic.  She is not thrombocytopenic.  Patient has a normal white blood cell count.  Ultrasound does not show any evidence of DVT.  Possible her symptoms may be radicular in nature.  No signs of infection.  She has normal perfusion.  Evaluation and diagnostic testing in the emergency department does not suggest an emergent condition requiring admission or immediate intervention beyond what has been performed at this time.  The patient is safe for discharge and has been instructed to return immediately for worsening symptoms, change in symptoms or any other concerns.         Final Clinical Impression(s) / ED Diagnoses Final diagnoses:  Left leg pain  Bruising    Rx / DC Orders ED Discharge Orders     None         Linwood Dibbles, MD 12/28/22 1512    Linwood Dibbles, MD 12/28/22 [phone]

## 2023-12-26 ENCOUNTER — Ambulatory Visit
Admission: EM | Admit: 2023-12-26 | Discharge: 2023-12-26 | Disposition: A | Discharge status: Home / Self Care | Attending: Family Medicine | Admitting: Family Medicine

## 2023-12-26 DIAGNOSIS — S86911A Strain of unspecified muscle(s) and tendon(s) at lower leg level, right leg, initial encounter: Secondary | ICD-10-CM

## 2023-12-26 DIAGNOSIS — S86912A Strain of unspecified muscle(s) and tendon(s) at lower leg level, left leg, initial encounter: Secondary | ICD-10-CM

## 2023-12-26 MED ORDER — NAPROXEN 500 MG PO TABS: 500.0000 mg | ORAL_TABLET | Freq: Two times a day (BID) | ORAL | 0 refills | Status: AC | PRN

## 2023-12-26 NOTE — ED Triage Notes (Signed)
 Patient here today with c/o bilat knee pain after working on her floors this past week. Patient states that she has since been having some weakness and tenderness in both knees since Thursday or Friday. Patient states that she has some instability when she gets out of bed in the morning and has also had some dizziness. Patient has taken BC powder and Excedrin with some relief.

## 2023-12-26 NOTE — Discharge Instructions (Addendum)
 Use the knee sleeves to help with swelling and support of the knee joint.  Start naproxen  twice daily as needed for pain/inflammation.  If symptoms do not improve in 1 week and/or they worsen please follow-up with your PCP for further evaluation.  Hope you feel better soon!

## 2023-12-26 NOTE — ED Provider Notes (Signed)
 [facility]    CSN: [id number] Arrival date & time: 12/26/23  1001      History   Chief Complaint Chief Complaint  Patient presents with   Knee Pain    HPI Haley Glass is a 33 y.o. female presents for knee pain.  Patient reports a week ago she was working on flooring in her home causing her to be up and down frequently.  States since then she been having bilateral knee pain and feels like they are weak and going to give out.  Does state they feel swollen but no bruising numbness or tingling.  No injury such as fall.  No history of knee surgeries or injuries in the past.  She is taking BC powder and Excedrin with some relief.  Patient denies pregnancy or breast-feeding.  No other concerns at this time  Knee Pain   Past Medical History:  Diagnosis Date   Heart murmur     There are no active problems to display for this patient.   History reviewed. No pertinent surgical history.  OB History   No obstetric history on file.      Home Medications    Prior to Admission medications   Medication Sig Start Date End Date Taking? Authorizing Provider  naproxen  (NAPROSYN ) 500 MG tablet Take 1 tablet (500 mg total) by mouth 2 (two) times daily as needed for moderate pain (pain score 4-6) (knee pain, inflammation). 12/26/23  Yes Cinch Ormond, Jodi R, NP  cetirizine  (ZYRTEC  ALLERGY) 10 MG tablet Take 1 tablet (10 mg total) by mouth daily. 10/24/22   Christopher Savannah, PA-C    Family History History reviewed. No pertinent family history.  Social History Social History   Tobacco Use   Smoking status: Every Day    Types: Cigarettes   Smokeless tobacco: Never  Vaping Use   Vaping status: Never Used  Substance Use Topics   Alcohol use: Yes    Comment: Rare   Drug use: No     Allergies   Amoxicillin, Penicillins, and Metronidazole   Review of Systems Review of Systems  Musculoskeletal:        Bilateral knee pain x 1 week     Physical Exam Triage Vital  Signs ED Triage Vitals  Encounter Vitals Group     BP 12/26/23 1017 111/80     Girls Systolic BP Percentile --      Girls Diastolic BP Percentile --      Boys Systolic BP Percentile --      Boys Diastolic BP Percentile --      Pulse Rate 12/26/23 1017 88     Resp 12/26/23 1017 16     Temp 12/26/23 1017 98 F (36.7 C)     Temp Source 12/26/23 1017 Oral     SpO2 12/26/23 1017 98 %     Weight --      Height --      Head Circumference --      Peak Flow --      Pain Score 12/26/23 1012 5     Pain Loc --      Pain Education --      Exclude from Growth Chart --    No data found.  Updated Vital Signs BP 111/80 (BP Location: Left Arm)   Pulse 88   Temp 98 F (36.7 C) (Oral)   Resp 16   LMP 07/31/2023 (Exact Date)   SpO2 98%   Visual Acuity Right Eye Distance:  Left Eye Distance:   Bilateral Distance:    Right Eye Near:   Left Eye Near:    Bilateral Near:     Physical Exam Vitals and nursing note reviewed.  Constitutional:      General: She is not in acute distress.    Appearance: Normal appearance. She is not ill-appearing, toxic-appearing or diaphoretic.  HENT:     Head: Normocephalic and atraumatic.  Eyes:     Pupils: Pupils are equal, round, and reactive to light.  Cardiovascular:     Rate and Rhythm: Normal rate.  Pulmonary:     Effort: Pulmonary effort is normal.  Musculoskeletal:     Right knee: No swelling, deformity, effusion, erythema, ecchymosis, lacerations or bony tenderness. Normal range of motion. Tenderness present over the medial joint line and lateral joint line. No patellar tendon tenderness. Normal alignment and normal patellar mobility.     Instability Tests: Anterior drawer test negative.     Left knee: No swelling, deformity, effusion, erythema, ecchymosis, lacerations or bony tenderness. Normal range of motion. Tenderness present over the medial joint line and lateral joint line. No patellar tendon tenderness. Normal alignment and normal  patellar mobility.     Instability Tests: Anterior drawer test negative.     Comments: Negative valgus and varus stress test  Skin:    General: Skin is warm.  Neurological:     General: No focal deficit present.     Mental Status: She is alert and oriented to person, place, and time.  Psychiatric:        Mood and Affect: Mood normal.        Behavior: Behavior normal.      UC Treatments / Results  Labs (all labs ordered are listed, but only abnormal results are displayed) Labs Reviewed - No data to display  EKG   Radiology No results found.  Procedures Procedures (including critical care time)  Medications Ordered in UC Medications - No data to display  Initial Impression / Assessment and Plan / UC Course  I have reviewed the triage vital signs and the nursing notes.  Pertinent labs & imaging results that were available during my care of the patient were reviewed by me and considered in my medical decision making (see chart for details).     Reviewed exam and symptoms with patient.  Discussed knee strain given recent activity.  Will defer imaging as no injury.  Patient fitted with bilateral knee braces and reviewed RICE therapy.  Will do trial of naproxen  twice daily as needed.  Patient to follow-up with PCP in 1 week if symptoms or not improving.  ER precautions reviewed. Final Clinical Impressions(s) / UC Diagnoses   Final diagnoses:  Strain of both knees, initial encounter     Discharge Instructions      Use the knee sleeves to help with swelling and support of the knee joint.  Start naproxen  twice daily as needed for pain/inflammation.  If symptoms do not improve in 1 week and/or they worsen please follow-up with your PCP for further evaluation.  Hope you feel better soon!    ED Prescriptions     Medication Sig Dispense Auth. Provider   naproxen  (NAPROSYN ) 500 MG tablet Take 1 tablet (500 mg total) by mouth 2 (two) times daily as needed for moderate pain  (pain score 4-6) (knee pain, inflammation). 14 tablet Awanda Wilcock, Jodi R, NP      PDMP not reviewed this encounter.   Loreda Myla SAUNDERS, NP 12/26/23 [phone]

## 2024-01-02 ENCOUNTER — Other Ambulatory Visit: Payer: Self-pay

## 2024-01-02 ENCOUNTER — Emergency Department (HOSPITAL_COMMUNITY)
Admission: EM | Admit: 2024-01-02 | Discharge: 2024-01-02 | Disposition: A | Discharge status: Home / Self Care | Attending: Emergency Medicine | Admitting: Emergency Medicine

## 2024-01-02 ENCOUNTER — Encounter (HOSPITAL_COMMUNITY): Payer: Self-pay

## 2024-01-02 DIAGNOSIS — J069 Acute upper respiratory infection, unspecified: Secondary | ICD-10-CM

## 2024-01-02 LAB — GROUP A STREP BY PCR: Group A Strep by PCR: NOT DETECTED

## 2024-01-02 LAB — RESP PANEL BY RT-PCR (RSV, FLU A&B, COVID)  RVPGX2
Influenza A by PCR: NEGATIVE
Influenza B by PCR: NEGATIVE
Resp Syncytial Virus by PCR: NEGATIVE
SARS Coronavirus 2 by RT PCR: NEGATIVE

## 2024-01-02 MED ORDER — LIDOCAINE VISCOUS HCL 2 % MT SOLN
15.0000 mL | Freq: Once | OROMUCOSAL | Status: AC
  Administered 2024-01-02: 15 mL via ORAL
  Filled 2024-01-02: qty 15

## 2024-01-02 MED ORDER — ALUM & MAG HYDROXIDE-SIMETH 200-200-20 MG/5ML PO SUSP
30.0000 mL | Freq: Once | ORAL | Status: AC
  Administered 2024-01-02: 30 mL via ORAL
  Filled 2024-01-02: qty 30

## 2024-01-02 MED ORDER — DEXAMETHASONE SOD PHOSPHATE PF 10 MG/ML IJ SOLN
10.0000 mg | Freq: Once | INTRAMUSCULAR | Status: AC
  Administered 2024-01-02: 10 mg via INTRAMUSCULAR

## 2024-01-02 MED ORDER — ACETAMINOPHEN 500 MG PO TABS
1000.0000 mg | ORAL_TABLET | Freq: Once | ORAL | Status: AC
  Administered 2024-01-02: 1000 mg via ORAL
  Filled 2024-01-02: qty 2

## 2024-01-02 MED ORDER — BENZONATATE 100 MG PO CAPS: 100.0000 mg | ORAL_CAPSULE | Freq: Three times a day (TID) | ORAL | 0 refills | Status: AC

## 2024-01-02 NOTE — Discharge Instructions (Addendum)
 It was a pleasure taking care of you today. You were seen in the Emergency Department for evaluation of sore throat and bodyaches. Your work-up was reassuring. Your respiratory panel was negative for the flu, COVID, RSV and your strep test was negative.  I suspect you likely have an unspecified viral infection.  As discussed, you may treat your symptoms with Tylenol  and/or ibuprofen as needed for body aches as well as sore throat lozenges and salt water gargles as needed.  I am sending you a prescription for medication called Tessalon  Perles, which will help with your cough reflex.  You may take this every 8 hours as needed for cough. Refer to the attached documentation for further management of your symptoms. Follow up with your PCP if your symptoms continue.  Please return to the ER if you experience chest pain, trouble breathing, intractable nausea/vomiting or any other life threatening illnesses.

## 2024-01-02 NOTE — ED Triage Notes (Signed)
 Pt came in for scratchy throat on Thursday and has been having sore throats, body aches, and a headache since. Pt also complains of back pain.

## 2024-01-02 NOTE — ED Provider Notes (Signed)
 [place] [facility] AT [facility] Provider Note   CSN: [id number] Arrival date & time: 01/02/24  9265     Patient presents with: Sore Throat (/) and Generalized Body Aches (/)   Haley Glass is a 33 y.o. female with no pertinent past medical history presents emergency department for evaluation of sore throat, headache and body aches.  Patient reports that she was traveling last week to [place]  and her symptoms started on Friday.  She initially thought it could have been allergies or related to her travel, but she began to develop a worsening headache and bodyaches starting yesterday.  She denies any sick contacts to her knowledge.  She has not taken any medication prior to arrival.  No prior surgeries.   HPI     Prior to Admission medications   Medication Sig Start Date End Date Taking? Authorizing Provider  benzonatate  (TESSALON ) 100 MG capsule Take 1 capsule (100 mg total) by mouth every 8 (eight) hours. 01/02/24  Yes Saleha Kalp, Marry RAMAN, PA-C  cetirizine  (ZYRTEC  ALLERGY) 10 MG tablet Take 1 tablet (10 mg total) by mouth daily. 10/24/22   Christopher Savannah, PA-C  naproxen  (NAPROSYN ) 500 MG tablet Take 1 tablet (500 mg total) by mouth 2 (two) times daily as needed for moderate pain (pain score 4-6) (knee pain, inflammation). 12/26/23   Mayer, Jodi R, NP    Allergies: Amoxicillin, Penicillins, and Metronidazole    Review of Systems  HENT:  Positive for sore throat.   Neurological:  Positive for headaches.    Updated Vital Signs BP (!) 141/96 (BP Location: Right Arm)   Pulse (!) 105   Temp 98.6 F (37 C) (Oral)   Resp 18   Ht 5' 6 (1.676 m)   Wt 97.5 kg   LMP 07/31/2023 (Exact Date)   SpO2 100%   BMI 34.70 kg/m   Physical Exam Vitals and nursing note reviewed.  Constitutional:      Appearance: Normal appearance.  HENT:     Head: Normocephalic and atraumatic.     Mouth/Throat:     Mouth: Mucous membranes are moist.     Pharynx: Posterior  oropharyngeal erythema present.     Comments: Mild erythema noted to the posterior right oropharynx.  No exudate Eyes:     General: No scleral icterus.       Right eye: No discharge.        Left eye: No discharge.     Conjunctiva/sclera: Conjunctivae normal.  Cardiovascular:     Rate and Rhythm: Normal rate and regular rhythm.     Pulses: Normal pulses.  Pulmonary:     Effort: Pulmonary effort is normal.     Breath sounds: Normal breath sounds.  Abdominal:     General: There is no distension.     Tenderness: There is no abdominal tenderness.  Musculoskeletal:        General: No deformity.     Cervical back: Normal range of motion.  Skin:    General: Skin is warm and dry.     Capillary Refill: Capillary refill takes less than 2 seconds.  Neurological:     Mental Status: She is alert.     Motor: No weakness.  Psychiatric:        Mood and Affect: Mood normal.     (all labs ordered are listed, but only abnormal results are displayed) Labs Reviewed  RESP PANEL BY RT-PCR (RSV, FLU A&B, COVID)  [id number]  GROUP A STREP BY PCR  EKG: None  Radiology: No results found.   Procedures   Medications Ordered in the ED  acetaminophen  (TYLENOL ) tablet 1,000 mg (1,000 mg Oral Given 01/02/24 0828)  alum & mag hydroxide-simeth (MAALOX/MYLANTA) 200-200-20 MG/5ML suspension 30 mL (30 mLs Oral Given 01/02/24 0828)    And  lidocaine  (XYLOCAINE ) 2 % viscous mouth solution 15 mL (15 mLs Oral Given 01/02/24 0828)  dexamethasone  (DECADRON ) injection 10 mg (10 mg Intramuscular Given 01/02/24 0828)   Clinical Course as of 01/02/24 1429  Sun Jan 02, 2024  0803 Patient's symptoms started on Friday.  She denies productive cough so strep is more likely.  However, she does have body aches and is afebrile.  Respiratory panel and strep are pending at this time.  I will provide her with Tylenol  to treat her headache and bodyaches and a viscous lidocaine  solution and shot of Decadron  for her sore throat.  [GD]  [id number] Patient's strep test and respiratory panel is negative.  Upon reassessment, patient reports her sore throat has mildly improved.  I did educate the patient that she should treat her symptoms with Tylenol  or ibuprofen as well as salt water gargles for sore throat.  I am going to prescribe the patient with a prescription for Tessalon  Per[place], to help with her cough.  Patient's vital signs are stable.  Patient is appropriate for discharge at this time. [GD]    Clinical Course User Index [GD] Torrence Marry RAMAN, PA-C                                 Medical Decision Making Risk OTC drugs. Prescription drug management.   33 year old female who presents emergency department for evaluation of sore throat, headache, body aches.  Differential diagnosis: Flu, COVID, RSV, unspecified URI, strep throat, tonsillitis  Please see clinical course above.    Final diagnoses:  Viral upper respiratory tract infection    ED Discharge Orders          Ordered    benzonatate  (TESSALON ) 100 MG capsule  Every 8 hours        01/02/24 0923               Torrence Marry RAMAN, PA-C 01/02/24 1429    Charlyn Sora, MD 01/03/24 [phone]
